# Patient Record
Sex: Female | Born: 1990 | Race: Black or African American | Hispanic: No | Marital: Single | Smoking: Former smoker
Health system: Southern US, Community
[De-identification: ages and names within clinical notes are randomized; demographics above are authoritative.]

## PROBLEM LIST (undated history)

## (undated) DIAGNOSIS — E079 Disorder of thyroid, unspecified: Secondary | ICD-10-CM

## (undated) DIAGNOSIS — B009 Herpesviral infection, unspecified: Secondary | ICD-10-CM

## (undated) DIAGNOSIS — J45909 Unspecified asthma, uncomplicated: Secondary | ICD-10-CM

## (undated) DIAGNOSIS — J40 Bronchitis, not specified as acute or chronic: Secondary | ICD-10-CM

## (undated) HISTORY — PX: TONSILLECTOMY: SUR1361

## (undated) HISTORY — PX: TUBAL LIGATION: SHX77

---
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
## 2011-11-11 ENCOUNTER — Encounter (HOSPITAL_BASED_OUTPATIENT_CLINIC_OR_DEPARTMENT_OTHER): Payer: Self-pay | Admitting: *Deleted

## 2011-11-11 ENCOUNTER — Emergency Department (HOSPITAL_BASED_OUTPATIENT_CLINIC_OR_DEPARTMENT_OTHER)
Admission: EM | Admit: 2011-11-11 | Discharge: 2011-11-11 | Disposition: A | Discharge status: Home / Self Care | Payer: No Typology Code available for payment source | Attending: Emergency Medicine | Admitting: Emergency Medicine

## 2011-11-11 DIAGNOSIS — S339XXA Sprain of unspecified parts of lumbar spine and pelvis, initial encounter: Secondary | ICD-10-CM | POA: Insufficient documentation

## 2011-11-11 DIAGNOSIS — S39012A Strain of muscle, fascia and tendon of lower back, initial encounter: Secondary | ICD-10-CM

## 2011-11-11 MED ORDER — IBUPROFEN 600 MG PO TABS: 600.0000 mg | ORAL_TABLET | Freq: Four times a day (QID) | ORAL | Status: AC | PRN
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
## 2013-01-09 ENCOUNTER — Encounter (HOSPITAL_BASED_OUTPATIENT_CLINIC_OR_DEPARTMENT_OTHER): Payer: Self-pay | Admitting: Emergency Medicine

## 2013-01-09 ENCOUNTER — Emergency Department (HOSPITAL_BASED_OUTPATIENT_CLINIC_OR_DEPARTMENT_OTHER): Payer: Medicaid Other

## 2013-01-09 ENCOUNTER — Emergency Department (HOSPITAL_BASED_OUTPATIENT_CLINIC_OR_DEPARTMENT_OTHER)
Admission: EM | Admit: 2013-01-09 | Discharge: 2013-01-10 | Disposition: A | Discharge status: Skilled Nursing Facility | Payer: Medicaid Other | Attending: Emergency Medicine | Admitting: Emergency Medicine

## 2013-01-09 DIAGNOSIS — Z8619 Personal history of other infectious and parasitic diseases: Secondary | ICD-10-CM | POA: Insufficient documentation

## 2013-01-09 DIAGNOSIS — J45909 Unspecified asthma, uncomplicated: Secondary | ICD-10-CM | POA: Diagnosis not present

## 2013-01-09 DIAGNOSIS — Z79899 Other long term (current) drug therapy: Secondary | ICD-10-CM | POA: Diagnosis not present

## 2013-01-09 DIAGNOSIS — R1084 Generalized abdominal pain: Secondary | ICD-10-CM | POA: Insufficient documentation

## 2013-01-09 DIAGNOSIS — O26899 Other specified pregnancy related conditions, unspecified trimester: Secondary | ICD-10-CM | POA: Diagnosis present

## 2013-01-09 DIAGNOSIS — R109 Unspecified abdominal pain: Secondary | ICD-10-CM

## 2013-01-09 HISTORY — DX: Unspecified asthma, uncomplicated: J45.909

## 2013-01-09 HISTORY — DX: Herpesviral infection, unspecified: B00.9

## 2013-01-09 HISTORY — DX: Bronchitis, not specified as acute or chronic: J40

## 2013-01-09 LAB — URINE MICROSCOPIC-ADD ON

## 2013-01-09 LAB — CBC WITH DIFFERENTIAL/PLATELET
Hemoglobin: 10.3 g/dL — ABNORMAL LOW (ref 12.0–15.0)
Lymphocytes Relative: 27 % (ref 12–46)
Lymphs Abs: 3.8 10*3/uL (ref 0.7–4.0)
MCV: 88.9 fL (ref 78.0–100.0)
Monocytes Relative: 8 % (ref 3–12)
Neutrophils Relative %: 63 % (ref 43–77)
Platelets: 655 10*3/uL — ABNORMAL HIGH (ref 150–400)
RBC: 3.51 MIL/uL — ABNORMAL LOW (ref 3.87–5.11)
WBC: 14.2 10*3/uL — ABNORMAL HIGH (ref 4.0–10.5)

## 2013-01-09 LAB — URINALYSIS, ROUTINE W REFLEX MICROSCOPIC
Hgb urine dipstick: NEGATIVE
Nitrite: NEGATIVE
Specific Gravity, Urine: 1.017 (ref 1.005–1.030)
Urobilinogen, UA: 0.2 mg/dL (ref 0.0–1.0)
pH: 5.5 (ref 5.0–8.0)

## 2013-01-09 LAB — COMPREHENSIVE METABOLIC PANEL
ALT: 29 U/L (ref 0–35)
Alkaline Phosphatase: 768 U/L — ABNORMAL HIGH (ref 39–117)
BUN: 33 mg/dL — ABNORMAL HIGH (ref 6–23)
CO2: 23 mEq/L (ref 19–32)
GFR calc Af Amer: 57 mL/min — ABNORMAL LOW (ref >90)
GFR calc non Af Amer: 49 mL/min — ABNORMAL LOW (ref >90)
Glucose, Bld: 99 mg/dL (ref 70–99)
Potassium: 4.9 mEq/L (ref 3.5–5.1)
Sodium: 135 mEq/L (ref 135–145)
Total Bilirubin: 0.4 mg/dL (ref 0.3–1.2)

## 2013-01-09 MED ORDER — CEFTRIAXONE SODIUM 1 G IJ SOLR
INTRAMUSCULAR | Status: AC
  Filled 2013-01-09: qty 10

## 2013-01-09 MED ORDER — MORPHINE SULFATE 4 MG/ML IJ SOLN
4.0000 mg | Freq: Once | INTRAMUSCULAR | Status: AC
  Administered 2013-01-09: 4 mg via INTRAVENOUS
  Filled 2013-01-09: qty 1

## 2013-01-09 MED ORDER — IOHEXOL 300 MG/ML  SOLN
100.0000 mL | Freq: Once | INTRAMUSCULAR | Status: AC | PRN
  Administered 2013-01-09: 100 mL via INTRAVENOUS

## 2013-01-09 MED ORDER — DEXTROSE 5 % IV SOLN
1.0000 g | Freq: Once | INTRAVENOUS | Status: AC
  Administered 2013-01-09: 1 g via INTRAVENOUS

## 2013-01-09 MED ORDER — SODIUM CHLORIDE 0.9 % IV SOLN
INTRAVENOUS | Status: DC
  Administered 2013-01-09: 23:00:00 via INTRAVENOUS

## 2013-01-09 MED ORDER — HYDROMORPHONE HCL PF 1 MG/ML IJ SOLN
1.0000 mg | Freq: Once | INTRAMUSCULAR | Status: AC
  Administered 2013-01-10: 1 mg via INTRAVENOUS
  Filled 2013-01-09: qty 1

## 2013-01-09 NOTE — ED Provider Notes (Signed)
Medical screening examination/treatment/procedure(s) were conducted as a shared visit with non-physician practitioner(s) and myself.  I personally evaluated the patient during the encounter  Doug Sou, MD 01/09/13 2339

## 2013-01-09 NOTE — ED Provider Notes (Signed)
Patient with low abdominal pain onset 2 hours ago. Pain is moderate to severe. Nothing makes symptoms better or worse patient also complains of pain at cesarean section scar. On exam no distress alert nontoxic abdomen well-healing surgical wound at suprapubic area. Normal bowel sounds Mildly distended. Mild tenderness over lower quadrants bilaterally.  Doug Sou, MD 01/09/13 2124
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
## 2013-01-09 NOTE — ED Notes (Addendum)
Pt had c section 9/22 and how having bad stomach pain, lower abdomen pain and right sided pain, pt has steri strips, denies vomiting diarrhea, c/o nausea

## 2013-01-09 NOTE — ED Notes (Signed)
Attempted to speak to the patient about why we needed her to drink the contrast. Explained that it would enable the CT to be more accurate. Patient would not make eye contact, patient would not answer questions, patient would not acknowledge that she was being spoken to.
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
## 2013-01-09 NOTE — ED Notes (Addendum)
[facility] called for transport to [facility]

## 2013-01-09 NOTE — ED Notes (Signed)
Pelvic cart is at the bedside set up and ready for the doctor to use. 

## 2013-01-09 NOTE — ED Provider Notes (Signed)
CT scan discussed with Dr. Chilton Si, from radiology. We'll treat empirically with ceftriaxone for, nephritis and retained products of conception. We also spoke with Dr. Aldona Bar [facility], as patient is requesting transfer there. Dr. Gerilyn Pilgrim accepts patient in transfer.. She'll be transferred to the emergency department. I have notified the charge nurse at [facility] to call Dr. Gerilyn Pilgrim upon patient's arrival  Doug Sou, MD 01/09/13 2319

## 2013-01-09 NOTE — ED Notes (Signed)
Pt was given contrast

## 2013-01-09 NOTE — ED Notes (Signed)
Mother of pt states that pt will not drink contrast, will speak with pt

## 2013-01-11 LAB — URINE CULTURE: Colony Count: 80000

## 2013-01-13 ENCOUNTER — Telehealth (HOSPITAL_COMMUNITY): Payer: Self-pay | Admitting: Emergency Medicine

## 2013-01-13 NOTE — ED Notes (Signed)
+  Chlamydia. Chart sent to EDP office for review. DHHS attached. 

## 2013-01-13 NOTE — ED Notes (Signed)
Patient has +Chlamydia. 

## 2013-01-17 ENCOUNTER — Telehealth (HOSPITAL_COMMUNITY): Payer: Self-pay | Admitting: Emergency Medicine

## 2013-01-17 NOTE — ED Notes (Signed)
Unable to contact patient via phone. Sent letter. °

## 2013-02-19 ENCOUNTER — Telehealth (HOSPITAL_COMMUNITY): Payer: Self-pay | Admitting: Emergency Medicine

## 2013-02-19 NOTE — ED Notes (Signed)
No response to letter sent after 30 days. Chart sent to Medical Records. °

## 2014-08-29 ENCOUNTER — Emergency Department (HOSPITAL_BASED_OUTPATIENT_CLINIC_OR_DEPARTMENT_OTHER)
Admission: EM | Admit: 2014-08-29 | Discharge: 2014-08-29 | Disposition: A | Discharge status: Home / Self Care | Payer: Medicaid Other | Attending: Emergency Medicine | Admitting: Emergency Medicine

## 2014-08-29 ENCOUNTER — Encounter (HOSPITAL_BASED_OUTPATIENT_CLINIC_OR_DEPARTMENT_OTHER): Payer: Self-pay | Admitting: *Deleted

## 2014-08-29 DIAGNOSIS — O9089 Other complications of the puerperium, not elsewhere classified: Secondary | ICD-10-CM | POA: Diagnosis not present

## 2014-08-29 DIAGNOSIS — Z8619 Personal history of other infectious and parasitic diseases: Secondary | ICD-10-CM | POA: Diagnosis not present

## 2014-08-29 DIAGNOSIS — O9953 Diseases of the respiratory system complicating the puerperium: Secondary | ICD-10-CM | POA: Diagnosis not present

## 2014-08-29 DIAGNOSIS — Z862 Personal history of diseases of the blood and blood-forming organs and certain disorders involving the immune mechanism: Secondary | ICD-10-CM | POA: Insufficient documentation

## 2014-08-29 DIAGNOSIS — J45909 Unspecified asthma, uncomplicated: Secondary | ICD-10-CM | POA: Insufficient documentation

## 2014-08-29 DIAGNOSIS — R6883 Chills (without fever): Secondary | ICD-10-CM | POA: Diagnosis not present

## 2014-08-29 DIAGNOSIS — N939 Abnormal uterine and vaginal bleeding, unspecified: Secondary | ICD-10-CM
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
## 2014-08-29 NOTE — ED Notes (Signed)
States has had vag bleeding since c section on may 12,  Has mbeeen passing blood clots x 2 days,  Some abd pain at site of c section inscision

## 2014-08-29 NOTE — ED Notes (Signed)
Pt in lobby went out to car stating "it's too much for her" and nodding head towardloud group.

## 2014-08-29 NOTE — ED Provider Notes (Signed)
CSN: [id number]     Arrival date & time 08/29/14  1743 History  This chart was scribed for Maureen Fossa, MD by Abel Presto, ED Scribe. This patient was seen in room MH06/MH06 and the patient's care was started at 7:34 PM.    Chief Complaint  Patient presents with  . Vaginal Bleeding    The history is provided by the patient and a parent. No language interpreter was used.    HPI Comments: ELLIZABETH Russell is a 24 y.o. female with PMHx of herpes and anemia who presents to the Emergency Department complaining of constant vaginal bleeding as much as 1 pad per hour since 08/18/14. Pt had a C-section on 08/18/14. She states she was given a transfusion at that time. Pt notes associated chills and tenderness to incision site. Pt was seen at [facility] with labs done earlier this evening. Pt denies fever. Pt is followed by OPGYN at [facility]. Symptoms are moderate and constant since.   Past Medical History  Diagnosis Date  . Asthma   . Bronchitis   . Herpes    Past Surgical History  Procedure Laterality Date  . Tonsillectomy    . Cesarean section     History reviewed. No pertinent family history. History  Substance Use Topics  . Smoking status: Never Smoker   . Smokeless tobacco: Not on file  . Alcohol Use: No   OB History    No data available     Review of Systems  Constitutional: Positive for chills. Negative for fever.  Gastrointestinal: Positive for abdominal pain.  Genitourinary: Positive for vaginal bleeding.  All other systems reviewed and are negative.    Allergies  Review of patient's allergies indicates no known allergies.  Home Medications   Prior to Admission medications   Not on File   BP 110/71 mmHg  Pulse 79  Temp(Src) 97.8 F (36.6 C) (Oral)  Resp 18  Wt 150 lb (68.04 kg)  SpO2 99%  LMP 08/29/2014 Physical Exam  Constitutional: She is oriented to person, place, and time. She appears well-developed and  well-nourished.  HENT:  Head: Normocephalic and atraumatic.  Cardiovascular: Normal rate and regular rhythm.   No murmur heard. Pulmonary/Chest: Effort normal and breath sounds normal. No respiratory distress.  Abdominal: Soft. There is tenderness (mild) in the suprapubic area. There is no rebound and no guarding.  csection site with sutures c/d/i  Genitourinary:  Small amount of dark vaginal bleeding, no tissue in os, no clots.  Cervix boggy and enlarged, slightly open  Musculoskeletal: She exhibits no edema or tenderness.  Neurological: She is alert and oriented to person, place, and time.  Skin: Skin is warm and dry.  Psychiatric: She has a normal mood and affect. Her behavior is normal.  Nursing note and vitals reviewed.   ED Course  Procedures (including critical care time) DIAGNOSTIC STUDIES: Oxygen Saturation is 99% on room air, normal by my interpretation.    COORDINATION OF CARE: 7:39 PM Discussed treatment plan with patient at beside, the patient agrees with the plan and has no further questions at this time.   Labs Review Labs Reviewed - No data to display  Imaging Review No results found.   EKG Interpretation None     7:40 PM Reviewed results from today [facility] ; hemoglobin was 10.9  MDM   Final diagnoses:  Vaginal bleeding   Patient here for evaluation of vaginal bleeding, had a C-section delivery on May 12.  Reviewed labs from patient's [facility] earlier today and her hemoglobin was 10.9 at that time. Patient has a small amount of bleeding on examination. History and examination is not consistent with maternal hemorrhage or endometritis. Discussed with patient importance of keeping her OB/GYN follow-up in 2 days as scheduled. Discussed bleeding return precautions.  I personally performed the services described in this documentation, which was scribed in my presence. The recorded information has been reviewed and is  accurate.     Maureen FossaElizabeth Lindsi Bayliss, MD 08/30/14 0100

## 2014-08-29 NOTE — ED Notes (Signed)
Other pt/visitors telling mom to get another child that is climbing on furniture and according to visitor"about to fall"

## 2014-08-29 NOTE — ED Notes (Signed)
[facility] [phone]

## 2014-08-29 NOTE — Discharge Instructions (Signed)
Abnormal Uterine Bleeding Abnormal uterine bleeding can affect women at various stages in life, including teenagers, women in their reproductive years, pregnant women, and women who have reached menopause. Several kinds of uterine bleeding are considered abnormal, including:  Bleeding or spotting between periods.   Bleeding after sexual intercourse.   Bleeding that is heavier or more than normal.   Periods that last longer than usual.  Bleeding after menopause.  Many cases of abnormal uterine bleeding are minor and simple to treat, while others are more serious. Any type of abnormal bleeding should be evaluated by your health care provider. Treatment will depend on the cause of the bleeding. HOME CARE INSTRUCTIONS Monitor your condition for any changes. The following actions may help to alleviate any discomfort you are experiencing:  Avoid the use of tampons and douches as directed by your health care provider.  Change your pads frequently. You should get regular pelvic exams and Pap tests. Keep all follow-up appointments for diagnostic tests as directed by your health care provider.  SEEK MEDICAL CARE IF:   Your bleeding lasts more than 1 week.   You feel dizzy at times.  SEEK IMMEDIATE MEDICAL CARE IF:   You pass out.   You are changing pads every 15 to 30 minutes.   You have abdominal pain.  You have a fever.   You become sweaty or weak.   You are passing large blood clots from the vagina.   You start to feel nauseous and vomit. MAKE SURE YOU:   Understand these instructions.  Will watch your condition.  Will get help right away if you are not doing well or get worse. Document Released: 03/25/2005 Document Revised: 03/30/2013 Document Reviewed: 10/22/2012 Exit[employer] Patient Information 2015 [employer]. This information is not intended to replace advice given to you by your health care provider. Make sure you discuss any questions you have with your  health care provider.  

## 2014-08-29 NOTE — ED Notes (Signed)
c-section on May 12th.  Reports that she continues to have vaginal bleeding.

## 2017-01-25 ENCOUNTER — Encounter (HOSPITAL_BASED_OUTPATIENT_CLINIC_OR_DEPARTMENT_OTHER): Payer: Self-pay | Admitting: Emergency Medicine

## 2017-01-25 ENCOUNTER — Emergency Department (HOSPITAL_BASED_OUTPATIENT_CLINIC_OR_DEPARTMENT_OTHER)
Admission: EM | Admit: 2017-01-25 | Discharge: 2017-01-25 | Disposition: A | Discharge status: Home / Self Care | Payer: Medicaid Other | Attending: Emergency Medicine | Admitting: Emergency Medicine

## 2017-01-25 DIAGNOSIS — J45909 Unspecified asthma, uncomplicated: Secondary | ICD-10-CM | POA: Insufficient documentation

## 2017-01-25 DIAGNOSIS — J069 Acute upper respiratory infection, unspecified: Secondary | ICD-10-CM | POA: Insufficient documentation

## 2017-01-25 DIAGNOSIS — R51 Headache: Secondary | ICD-10-CM | POA: Diagnosis present

## 2017-01-25 NOTE — ED Triage Notes (Signed)
PT presents with c/o pain to her face around her nose and stuffy nose.

## 2017-01-25 NOTE — ED Provider Notes (Signed)
[facility] EMERGENCY DEPARTMENT Provider Note   CSN: [id number] Arrival date & time: 01/25/17  1450     History   Chief Complaint Chief Complaint  Patient presents with  . Headache    HPI Maureen Russell is a 26 y.o. female.  Chief complaint is cough, cold, and sinus pressure.  HPI:  Patient has had cold symptoms since Wednesday. Runny nose. Sinus burning. Sre throat. Occasional cough. No fevr. No shortness of breath. No sinus pain. No ear pain.  Past Medical History:  Diagnosis Date  . Asthma   . Bronchitis   . Herpes     There are no active problems to display for this patient.   Past Surgical History:  Procedure Laterality Date  . CESAREAN SECTION    . TONSILLECTOMY      OB History    No data available       Home Medications    Prior to Admission medications   Not on File    Family History No family history on file.  Social History Social History  Substance Use Topics  . Smoking status: Never Smoker  . Smokeless tobacco: Not on file  . Alcohol use No     Allergies   Patient has no known allergies.   Review of Systems Review of Systems  Constitutional: Negative for appetite change, chills, diaphoresis, fatigue and fever.  HENT: Positive for congestion, postnasal drip, rhinorrhea, sinus pressure and sneezing. Negative for mouth sores, sore throat and trouble swallowing.   Eyes: Negative for visual disturbance.  Respiratory: Positive for cough. Negative for chest tightness, shortness of breath and wheezing.   Cardiovascular: Negative for chest pain.  Gastrointestinal: Negative for abdominal distention, abdominal pain, diarrhea, nausea and vomiting.  Endocrine: Negative for polydipsia, polyphagia and polyuria.  Genitourinary: Negative for dysuria, frequency and hematuria.  Musculoskeletal: Negative for gait problem.  Skin: Negative for color change, pallor and rash.  Neurological: Negative for dizziness, syncope, light-headedness  and headaches.  Hematological: Does not bruise/bleed easily.  Psychiatric/Behavioral: Negative for behavioral problems and confusion.     Physical Exam Updated Vital Signs BP 108/74 (BP Location: Left Arm)   Pulse 68   Temp 98.1 F (36.7 C) (Oral)   Resp 16   LMP 01/11/2017   SpO2 100%   Physical Exam  Constitutional: She is oriented to person, place, and time. She appears well-developed and well-nourished. No distress.  HENT:  Head: Normocephalic.  No posterior pharyngeal erythema or exudate. Normal TMs. No adenopathy in the neck. No stridor. Normal voice. Clear lungs.  Eyes: Pupils are equal, round, and reactive to light. Conjunctivae are normal. No scleral icterus.  Neck: Normal range of motion. Neck supple. No thyromegaly present.  Cardiovascular: Normal rate and regular rhythm.  Exam reveals no gallop and no friction rub.   No murmur heard. Pulmonary/Chest: Effort normal and breath sounds normal. No respiratory distress. She has no wheezes. She has no rales.  Abdominal: Soft. Bowel sounds are normal. She exhibits no distension. There is no tenderness. There is no rebound.  Musculoskeletal: Normal range of motion.  Neurological: She is alert and oriented to person, place, and time.  Skin: Skin is warm and dry. No rash noted.  Psychiatric: She has a normal mood and affect. Her behavior is normal.     ED Treatments / Results  Labs (all labs ordered are listed, but only abnormal results are displayed) Labs Reviewed - No data to display  EKG  EKG Interpretation None  Radiology No results found.  Procedures Procedures (including critical care time)  Medications Ordered in ED Medications - No data to display   Initial Impression / Assessment and Plan / ED Course  I have reviewed the triage vital signs and the nursing notes.  Pertinent labs & imaging results that were available during my care of the patient were reviewed by me and considered in my medical  decision making (see chart for details).     Uncomplicated upper respiratory infection.  Final Clinical Impressions(s) / ED Diagnoses   Final diagnoses:  Upper respiratory tract infection, unspecified type    New Prescriptions New Prescriptions   No medications on file     Rolland Porter, MD 01/25/17 1659

## 2017-05-30 ENCOUNTER — Encounter (HOSPITAL_BASED_OUTPATIENT_CLINIC_OR_DEPARTMENT_OTHER): Payer: Self-pay | Admitting: *Deleted

## 2017-05-30 ENCOUNTER — Emergency Department (HOSPITAL_BASED_OUTPATIENT_CLINIC_OR_DEPARTMENT_OTHER)
Admission: EM | Admit: 2017-05-30 | Discharge: 2017-05-30 | Disposition: A | Discharge status: Home / Self Care | Payer: Medicaid Other | Attending: Emergency Medicine | Admitting: Emergency Medicine

## 2017-05-30 ENCOUNTER — Other Ambulatory Visit: Payer: Self-pay

## 2017-05-30 DIAGNOSIS — M791 Myalgia, unspecified site: Secondary | ICD-10-CM | POA: Diagnosis present

## 2017-05-30 DIAGNOSIS — R072 Precordial pain: Secondary | ICD-10-CM | POA: Diagnosis not present

## 2017-05-30 DIAGNOSIS — J45909 Unspecified asthma, uncomplicated: Secondary | ICD-10-CM | POA: Diagnosis not present

## 2017-05-30 DIAGNOSIS — R1013 Epigastric pain: Secondary | ICD-10-CM | POA: Diagnosis not present

## 2017-05-30 DIAGNOSIS — J069 Acute upper respiratory infection, unspecified: Secondary | ICD-10-CM | POA: Insufficient documentation

## 2017-05-30 DIAGNOSIS — R52 Pain, unspecified: Secondary | ICD-10-CM

## 2017-05-30 MED ORDER — KETOROLAC TROMETHAMINE 30 MG/ML IJ SOLN
30.0000 mg | Freq: Once | INTRAMUSCULAR | Status: AC
  Administered 2017-05-30: 30 mg via INTRAVENOUS
  Filled 2017-05-30: qty 1

## 2017-05-30 MED ORDER — METHOCARBAMOL 500 MG PO TABS: 500.0000 mg | ORAL_TABLET | Freq: Four times a day (QID) | ORAL | 0 refills | Status: AC

## 2017-05-30 MED ORDER — GI COCKTAIL ~~LOC~~
30.0000 mL | Freq: Once | ORAL | Status: AC
  Administered 2017-05-30: 30 mL via ORAL
  Filled 2017-05-30: qty 30

## 2017-05-30 NOTE — ED Provider Notes (Signed)
[facility] EMERGENCY DEPARTMENT Provider Note   CSN: [id number] Arrival date & time: 05/30/17  4098     History   Chief Complaint No chief complaint on file.   HPI Maureen Russell is a 27 y.o. female.  Patient is a 27 year old female presenting with body aches.  PMH significant for childhood asthma which has been well controlled for several years without need for inhaler.  Onset 2 days ago with sudden onset body aches including upper back, right shoulder, and neck along with clear rhinorrhea, and tension-like headache.  Patient took a dose of Advil without improvement.  Patient also endorsing mild epigastric pain.  Pain occasionally feels like it substernal.  Pain is not affected by eating.  Patient denies history of NSAID use with exception of single dose of Advil during onset.  She denies history of fevers or chills, nausea or vomiting, diarrhea, shortness of breath, wheeze.  Patient is a non-smoker and denies alcohol or illicit drug use.  She works at [employer].      Past Medical History:  Diagnosis Date  . Asthma   . Bronchitis   . Herpes     There are no active problems to display for this patient.   Past Surgical History:  Procedure Laterality Date  . CESAREAN SECTION    . TONSILLECTOMY      OB History    No data available       Home Medications    Prior to Admission medications   Medication Sig Start Date End Date Taking? Authorizing Provider  methocarbamol (ROBAXIN) 500 MG tablet Take 1 tablet (500 mg total) by mouth 4 (four) times daily for 5 days. 05/30/17 06/04/17  Wendee Beavers, DO    Family History History reviewed. No pertinent family history.  Social History Social History   Tobacco Use  . Smoking status: Never Smoker  Substance Use Topics  . Alcohol use: No  . Drug use: No     Allergies   Patient has no known allergies.   Review of Systems Review of Systems  Constitutional: Negative for chills, diaphoresis,  fatigue and fever.  HENT: Positive for rhinorrhea. Negative for congestion, ear pain, sneezing and sore throat.   Eyes: Negative for pain and visual disturbance.  Respiratory: Negative for cough, shortness of breath and wheezing.   Cardiovascular: Positive for chest pain. Negative for palpitations.  Gastrointestinal: Positive for abdominal pain. Negative for diarrhea, nausea and vomiting.  Genitourinary: Negative for dysuria and hematuria.  Musculoskeletal: Positive for arthralgias, back pain and neck pain. Negative for neck stiffness.  Skin: Negative for color change and rash.  Neurological: Positive for headaches. Negative for dizziness.  All other systems reviewed and are negative.    Physical Exam Updated Vital Signs BP 103/62 (BP Location: Right Arm)   Pulse 86   Temp 98.3 F (36.8 C) (Oral)   Resp 18   Ht 5\' 4"  (1.626 m)   Wt 59.6 kg (131 lb 6.3 oz)   LMP 05/04/2017 (Approximate)   SpO2 97%   BMI 22.55 kg/m   Physical Exam  Constitutional: She appears well-developed and well-nourished. No distress.  HENT:  Head: Normocephalic and atraumatic.  Eyes: Conjunctivae and EOM are normal. Pupils are equal, round, and reactive to light. No scleral icterus.  Neck: Normal range of motion. Neck supple.  Mild tenderness on sternocleidomastoid bilaterally  Cardiovascular: Normal rate and regular rhythm.  No murmur heard. Pulmonary/Chest: Effort normal and breath sounds normal. No respiratory distress.  Abdominal: Soft. She exhibits no distension and no mass. There is no rebound and no guarding.  Mild epigastric pain present  Musculoskeletal: She exhibits no edema.  Lymphadenopathy:    She has no cervical adenopathy.  Neurological: She is alert.  Skin: Skin is warm and dry. No rash noted. She is not diaphoretic. No pallor.  Psychiatric: She has a normal mood and affect.  Nursing note and vitals reviewed.    ED Treatments / Results  Labs (all labs ordered are listed, but  only abnormal results are displayed) Labs Reviewed - No data to display  EKG  EKG Interpretation  Date/Time:  Friday May 30 2017 11:06:04 EST Ventricular Rate:  57 PR Interval:    QRS Duration: 90 QT Interval:  422 QTC Calculation: 411 R Axis:   68 Text Interpretation:  Sinus rhythm, pw small Borderline low voltage, extremity leads No previous ECGs available Confirmed by Alvira MondaySchlossman, Erin ([id number]) on 05/30/2017 11:17:39 AM       Radiology No results found.  Procedures Procedures (including critical care time)  Medications Ordered in ED Medications  gi cocktail (Maalox,Lidocaine,Donnatal) (30 mLs Oral Given 05/30/17 1045)  ketorolac (TORADOL) 30 MG/ML injection 30 mg (30 mg Intravenous Given 05/30/17 1045)     Initial Impression / Assessment and Plan / ED Course  I have reviewed the triage vital signs and the nursing notes.  Pertinent labs & imaging results that were available during my care of the patient were reviewed by me and considered in my medical decision making (see chart for details).  Patient is a 27 year old female presenting with body aches.  PMH significant for childhood asthma which has been well controlled for several years without need for inhaler.  Vital stable on arrival.  Patient well-appearing on exam.  Does have mild epigastric pain with intermittent episodes of substernal chest pain.  Will get EKG.  No history of NSAID abuse or GI bleed.  Will give GI cocktail.  Patient without jaundice with negative Murphy sign making gallbladder disease unlikely.  Patient able to tolerate oral intake and comfortable making pancreatitis less likely.  Normotensive without hypertension dissection unlikely.  Suspect viral URI symptoms occluding clear rhinorrhea and some pharyngeal erythema.  We will give a dose of Toradol for multiple joint pains.  Headache is resolved at present.  EKG NSR without ST changes or T wave abnormalities.  Patient's epigastric pain improved with  GI cocktail.  Suspect epigastric pain is related to viral illness.  Multiple joint pains improved with Toradol use.  Will give prescription for Robaxin.  Reviewed return precautions.  Instructed patient to follow-up with PCP.  Final Clinical Impressions(s) / ED Diagnoses   Final diagnoses:  Viral URI  Epigastric pain  Body aches    ED Discharge Orders        Ordered    methocarbamol (ROBAXIN) 500 MG tablet  4 times daily     05/30/17 1033       Wendee BeaversMcMullen, David J, DO 05/30/17 1202    Alvira MondaySchlossman, Erin, MD 06/01/17 [phone]

## 2017-05-30 NOTE — ED Notes (Signed)
ED Provider at bedside. 

## 2017-05-30 NOTE — ED Triage Notes (Signed)
Started 2 days ago, Pt started hurting all over, neck, back, and sharp pains in stomach.

## 2017-05-30 NOTE — Discharge Instructions (Addendum)
Your symptoms are likely from a viral infection. Often times viruses can irritate the stomach.  Avoid ibuprofen or other NSAIDs and take Tylenol for headache and body ache every 6 hours.  I have also given you a muscle relaxer which you can take up to 4 times daily.  I would advise you not to drive while on this medication as this can be sedating.  Do not operate heavy machinery while taking this medication.

## 2017-06-11 ENCOUNTER — Other Ambulatory Visit: Payer: Self-pay

## 2017-06-11 ENCOUNTER — Encounter (HOSPITAL_BASED_OUTPATIENT_CLINIC_OR_DEPARTMENT_OTHER): Payer: Self-pay | Admitting: Emergency Medicine

## 2017-06-11 ENCOUNTER — Emergency Department (HOSPITAL_BASED_OUTPATIENT_CLINIC_OR_DEPARTMENT_OTHER)
Admission: EM | Admit: 2017-06-11 | Discharge: 2017-06-11 | Disposition: A | Discharge status: Home / Self Care | Payer: Medicaid Other | Attending: Emergency Medicine | Admitting: Emergency Medicine

## 2017-06-11 DIAGNOSIS — J029 Acute pharyngitis, unspecified: Secondary | ICD-10-CM | POA: Diagnosis not present

## 2017-06-11 DIAGNOSIS — H6691 Otitis media, unspecified, right ear: Secondary | ICD-10-CM | POA: Insufficient documentation

## 2017-06-11 DIAGNOSIS — J45909 Unspecified asthma, uncomplicated: Secondary | ICD-10-CM | POA: Diagnosis not present

## 2017-06-11 MED ORDER — IBUPROFEN 400 MG PO TABS
400.0000 mg | ORAL_TABLET | Freq: Once | ORAL | Status: AC
  Administered 2017-06-11: 400 mg via ORAL
  Filled 2017-06-11: qty 1

## 2017-06-11 MED ORDER — AMOXICILLIN 500 MG PO CAPS: 500.0000 mg | ORAL_CAPSULE | Freq: Three times a day (TID) | ORAL | 0 refills | Status: DC

## 2017-06-11 MED ORDER — DEXAMETHASONE 4 MG PO TABS
8.0000 mg | ORAL_TABLET | Freq: Once | ORAL | Status: AC
  Administered 2017-06-11: 8 mg via ORAL
  Filled 2017-06-11: qty 2

## 2017-06-11 NOTE — ED Triage Notes (Signed)
Sore throat, sinus pressure, fatigue, and body aches since yesterday. Children also sick. They are being treated for strep.

## 2017-06-11 NOTE — ED Notes (Signed)
Pt discharged to home with family. NAD.  

## 2017-06-12 ENCOUNTER — Emergency Department (HOSPITAL_BASED_OUTPATIENT_CLINIC_OR_DEPARTMENT_OTHER)
Admission: EM | Admit: 2017-06-12 | Discharge: 2017-06-12 | Disposition: A | Discharge status: Home / Self Care | Payer: Medicaid Other | Attending: Emergency Medicine | Admitting: Emergency Medicine

## 2017-06-12 ENCOUNTER — Other Ambulatory Visit: Payer: Self-pay

## 2017-06-12 ENCOUNTER — Encounter (HOSPITAL_BASED_OUTPATIENT_CLINIC_OR_DEPARTMENT_OTHER): Payer: Self-pay | Admitting: Emergency Medicine

## 2017-06-12 DIAGNOSIS — J45909 Unspecified asthma, uncomplicated: Secondary | ICD-10-CM | POA: Diagnosis not present

## 2017-06-12 DIAGNOSIS — J029 Acute pharyngitis, unspecified: Secondary | ICD-10-CM

## 2017-06-12 MED ORDER — IBUPROFEN 600 MG PO TABS: 600.0000 mg | ORAL_TABLET | Freq: Four times a day (QID) | ORAL | 0 refills | Status: DC | PRN

## 2017-06-12 NOTE — ED Provider Notes (Signed)
[facility] EMERGENCY DEPARTMENT Provider Note   CSN: [id number] Arrival date & time: 06/12/17  1037     History   Chief Complaint Chief Complaint  Patient presents with  . flu like symptoms    HPI Maureen Russell is a 27 y.o. female.  The history is provided by the patient and medical records. No language interpreter was used.   Maureen Russell is a 27 y.o. female  with a PMH as listed below who presents to the Emergency Department complaining of persistent sore throat, sinus pressure and body aches.  She has a daughter at home who tested positive for strep and was started on treatment.  She was seen in ED yesterday.  She was thought to have right otitis media as well as likely strep throat.  No strep test was performed, but she was started on amoxicillin for likely given daughter tested positive.  She returns to ER today for persistent symptoms and inquiring why she was not tested for strep throat.  Denies fever or chills.  No difficulty swallowing.   Past Medical History:  Diagnosis Date  . Asthma   . Bronchitis   . Herpes     There are no active problems to display for this patient.   Past Surgical History:  Procedure Laterality Date  . CESAREAN SECTION    . TONSILLECTOMY    . TUBAL LIGATION      OB History    No data available       Home Medications    Prior to Admission medications   Medication Sig Start Date End Date Taking? Authorizing Provider  amoxicillin (AMOXIL) 500 MG capsule Take 1 capsule (500 mg total) by mouth 3 (three) times daily. 06/11/17   Raeford RazorKohut, Stephen, MD  ibuprofen (ADVIL,MOTRIN) 600 MG tablet Take 1 tablet (600 mg total) by mouth every 6 (six) hours as needed. 06/12/17   Ward, Chase PicketJaime Pilcher, PA-C    Family History No family history on file.  Social History Social History   Tobacco Use  . Smoking status: Never Smoker  . Smokeless tobacco: Never Used  Substance Use Topics  . Alcohol use: No  . Drug use: No      Allergies   Patient has no known allergies.   Review of Systems Review of Systems  Constitutional: Negative for chills and fever.  HENT: Positive for congestion, sinus pressure and sore throat. Negative for trouble swallowing.   Respiratory: Negative for cough and shortness of breath.   Cardiovascular: Negative for chest pain.  Gastrointestinal: Negative for abdominal pain.     Physical Exam Updated Vital Signs LMP 06/05/2017   Physical Exam  Constitutional: She appears well-developed and well-nourished. No distress.  HENT:  Head: Normocephalic and atraumatic.  Oropharynx with erythema and tonsillar hypertrophy, no exudates appreciated.  No peritonsillar abscess.  Midline uvula.  No focal areas of sinus tenderness.  Right TM erythematous.  Neck: Neck supple.  Cardiovascular: Normal rate, regular rhythm and normal heart sounds.  No murmur heard. Pulmonary/Chest: Effort normal and breath sounds normal. No respiratory distress. She has no wheezes. She has no rales.  Lungs clear to auscultation bilaterally.  Musculoskeletal: Normal range of motion.  Neurological: She is alert.  Skin: Skin is warm and dry.  Nursing note and vitals reviewed.    ED Treatments / Results  Labs (all labs ordered are listed, but only abnormal results are displayed) Labs Reviewed - No data to display  EKG  EKG Interpretation None  Radiology No results found.  Procedures Procedures (including critical care time)  Medications Ordered in ED Medications - No data to display   Initial Impression / Assessment and Plan / ED Course  I have reviewed the triage vital signs and the nursing notes.  Pertinent labs & imaging results that were available during my care of the patient were reviewed by me and considered in my medical decision making (see chart for details).    Maureen Russell is a 27 y.o. female who presents to ED for sore throat, sinus pressure.  Seen in emergency  department yesterday where she was started on amoxicillin for likely strep given daughter tested positive.  She returns to ER today for persistent symptoms and inquiring about needing strep test.  Discussed that she is already on treatment for strep, therefore strep test would not change management or plan of care.  Encouraged her to continue treatment.  Ibuprofen as needed for pain.  PCP follow-up if symptoms persist.  All questions answered.   Final Clinical Impressions(s) / ED Diagnoses   Final diagnoses:  Sore throat    ED Discharge Orders        Ordered    ibuprofen (ADVIL,MOTRIN) 600 MG tablet  Every 6 hours PRN     06/12/17 1054       Ward, Chase Picket, PA-C 06/12/17 1145    Raeford Razor, MD 06/12/17 1212

## 2017-06-12 NOTE — ED Triage Notes (Signed)
Sore throat, body aches for several days. Started on amoxicillin yesterday due to daughter having strep throat. Pt states "no body tested me for anything and I need to know what is going on"

## 2017-06-12 NOTE — Discharge Instructions (Signed)
Continue taking your antibiotics.  Ibuprofen as needed for pain.  Follow up with your primary care doctor if your symptoms persist.  Return to ER for new or worsening symptoms, any additional concerns.

## 2017-06-13 NOTE — ED Provider Notes (Signed)
[facility] EMERGENCY DEPARTMENT Provider Note   CSN: [id number] Arrival date & time: 06/11/17  0908     History   Chief Complaint Chief Complaint  Patient presents with  . Sore Throat    HPI Maureen Russell is a 27 y.o. female.  HPI   27 year old female with sore throat.  Onset several days ago.  Associated body aches, fatigue and subjective fever.  Multiple children currently be treated for strep throat.  No abdominal pain.  No urinary complaints.  Pain in both ears, R>L.   Past Medical History:  Diagnosis Date  . Asthma   . Bronchitis   . Herpes     There are no active problems to display for this patient.   Past Surgical History:  Procedure Laterality Date  . CESAREAN SECTION    . TONSILLECTOMY    . TUBAL LIGATION      OB History    No data available       Home Medications    Prior to Admission medications   Medication Sig Start Date End Date Taking? Authorizing Provider  amoxicillin (AMOXIL) 500 MG capsule Take 1 capsule (500 mg total) by mouth 3 (three) times daily. 06/11/17   Raeford Razor, MD  ibuprofen (ADVIL,MOTRIN) 600 MG tablet Take 1 tablet (600 mg total) by mouth every 6 (six) hours as needed. 06/12/17   Ward, Chase Picket, PA-C    Family History No family history on file.  Social History Social History   Tobacco Use  . Smoking status: Never Smoker  . Smokeless tobacco: Never Used  Substance Use Topics  . Alcohol use: No  . Drug use: No     Allergies   Patient has no known allergies.   Review of Systems Review of Systems  All systems reviewed and negative, other than as noted in HPI.  Physical Exam Updated Vital Signs BP 110/74 (BP Location: Left Arm)   Pulse 86   Temp 98.2 F (36.8 C) (Oral)   Resp 19   Ht 5\' 4"  (1.626 m)   Wt 59.4 kg (131 lb)   LMP 06/05/2017   SpO2 100%   BMI 22.49 kg/m   Physical Exam  Constitutional: She appears well-developed and well-nourished. No distress.  HENT:  Head:  Normocephalic and atraumatic.  Scarring of bilateral tympanic membranes.  Left is otherwise unremarkable.  Right is erythematous.  Dull.  Retracted.  Pharyngitis without exudate.  Uvula is midline.  Normal sounding voice.  Neck is supple.  Handling secretions.  No stridor.  Eyes: Conjunctivae are normal. Right eye exhibits no discharge. Left eye exhibits no discharge.  Neck: Neck supple.  Cardiovascular: Normal rate, regular rhythm and normal heart sounds. Exam reveals no gallop and no friction rub.  No murmur heard. Pulmonary/Chest: Effort normal and breath sounds normal. No respiratory distress.  Abdominal: Soft. She exhibits no distension. There is no tenderness.  Musculoskeletal: She exhibits no edema or tenderness.  Neurological: She is alert.  Skin: Skin is warm and dry.  Psychiatric: She has a normal mood and affect. Her behavior is normal. Thought content normal.  Nursing note and vitals reviewed.    ED Treatments / Results  Labs (all labs ordered are listed, but only abnormal results are displayed) Labs Reviewed - No data to display  EKG  EKG Interpretation None       Radiology No results found.  Procedures Procedures (including critical care time)  Medications Ordered in ED Medications  dexamethasone (DECADRON) tablet 8  mg (8 mg Oral Given 06/11/17 1015)  ibuprofen (ADVIL,MOTRIN) tablet 400 mg (400 mg Oral Given 06/11/17 1015)     Initial Impression / Assessment and Plan / ED Course  I have reviewed the triage vital signs and the nursing notes.  Pertinent labs & imaging results that were available during my care of the patient were reviewed by me and considered in my medical decision making (see chart for details).     27 year old female with sore throat, fevers and ear pain.  Otitis media on the right on exam.  Some pharyngitis without exudate.  Low suspicion for serious deep space neck infection.  Multiple kids with strep throat.  Testing was deferred.  Will  discussion and treat her.  Final Clinical Impressions(s) / ED Diagnoses   Final diagnoses:  Pharyngitis, unspecified etiology  Right otitis media, unspecified otitis media type    ED Discharge Orders        Ordered    amoxicillin (AMOXIL) 500 MG capsule  3 times daily     06/11/17 0954       Raeford RazorKohut, Livia Tarr, MD 06/13/17 1547

## 2017-07-10 ENCOUNTER — Encounter (HOSPITAL_BASED_OUTPATIENT_CLINIC_OR_DEPARTMENT_OTHER): Payer: Self-pay

## 2017-07-10 ENCOUNTER — Emergency Department (HOSPITAL_BASED_OUTPATIENT_CLINIC_OR_DEPARTMENT_OTHER): Payer: Medicaid Other

## 2017-07-10 ENCOUNTER — Other Ambulatory Visit: Payer: Self-pay

## 2017-07-10 ENCOUNTER — Emergency Department (HOSPITAL_BASED_OUTPATIENT_CLINIC_OR_DEPARTMENT_OTHER)
Admission: EM | Admit: 2017-07-10 | Discharge: 2017-07-10 | Disposition: A | Discharge status: Home / Self Care | Payer: Medicaid Other | Attending: Emergency Medicine | Admitting: Emergency Medicine

## 2017-07-10 DIAGNOSIS — R05 Cough: Secondary | ICD-10-CM | POA: Diagnosis not present

## 2017-07-10 DIAGNOSIS — R058 Other specified cough: Secondary | ICD-10-CM

## 2017-07-10 DIAGNOSIS — J45909 Unspecified asthma, uncomplicated: Secondary | ICD-10-CM | POA: Diagnosis not present

## 2017-07-10 DIAGNOSIS — E0789 Other specified disorders of thyroid: Secondary | ICD-10-CM

## 2017-07-10 DIAGNOSIS — E079 Disorder of thyroid, unspecified: Secondary | ICD-10-CM | POA: Diagnosis not present

## 2017-07-10 HISTORY — DX: Disorder of thyroid, unspecified: E07.9

## 2017-07-10 NOTE — ED Provider Notes (Signed)
[facility] Provider Note: Lowella Dell, MD, FACEP  CSN: [id number] MRN: [id number] ARRIVAL: 07/10/17 at 0038 ROOM: [facility]   CHIEF COMPLAINT  Sore Throat   HISTORY OF PRESENT ILLNESS  07/10/17 1:12 AM Maureen Russell is a 27 y.o. female who was treated for strep throat about a month ago with amoxicillin.  She was also recently diagnosed with what sounds like hyperthyroidism.  She is here with a 2-day history of cough productive of sputum that has been blood streaked at times.  She is also complaining of a sore throat.  She is having pain in the left side of her anterior neck, worse with swallowing or palpation.  She is not aware of having a fever.  She rates the pain in her neck is a 7 out of 10.  She has an appointment with an endocrinologist later today.   Past Medical History:  Diagnosis Date  . Asthma   . Bronchitis   . Herpes   . Thyroid disease     Past Surgical History:  Procedure Laterality Date  . CESAREAN SECTION    . TONSILLECTOMY    . TUBAL LIGATION      No family history on file.  Social History   Tobacco Use  . Smoking status: Never Smoker  . Smokeless tobacco: Never Used  Substance Use Topics  . Alcohol use: No  . Drug use: No    Prior to Admission medications   Medication Sig Start Date End Date Taking? Authorizing Provider  amoxicillin (AMOXIL) 500 MG capsule Take 1 capsule (500 mg total) by mouth 3 (three) times daily. 06/11/17   Raeford Razor, MD  ibuprofen (ADVIL,MOTRIN) 600 MG tablet Take 1 tablet (600 mg total) by mouth every 6 (six) hours as needed. 06/12/17   Ward, Chase Picket, PA-C    Allergies Patient has no known allergies.   REVIEW OF SYSTEMS  Negative except as noted here or in the History of Present Illness.   PHYSICAL EXAMINATION  Initial Vital Signs Blood pressure 126/79, pulse 60, temperature 98.2 F (36.8 C), temperature source Oral, resp. rate 16, height 5\' 7"  (1.702 m), weight 61.2 kg (135 lb), last  menstrual period 07/03/2017, SpO2 100 %.  Examination General: Well-developed, well-nourished female in no acute distress; appearance consistent with age of record HENT: normocephalic; atraumatic; tender, mildly enlarged left lobe thyroid; no pharyngeal erythema or exudate; no stridor; no dysphonia Eyes: pupils equal, round and reactive to light; extraocular muscles intact Neck: supple Heart: regular rate and rhythm Lungs: clear to auscultation bilaterally Abdomen: soft; nondistended; nontender; bowel sounds present Extremities: No deformity; full range of motion; pulses normal Neurologic: Awake, alert and oriented; motor function intact in all extremities and symmetric; no facial droop Skin: Warm and dry Psychiatric: Normal mood and affect   RESULTS  Summary of this visit's results, reviewed by myself:   EKG Interpretation  Date/Time:    Ventricular Rate:    PR Interval:    QRS Duration:   QT Interval:    QTC Calculation:   R Axis:     Text Interpretation:        Laboratory Studies: No results found for this or any previous visit (from the past 24 hour(s)). Imaging Studies: Dg Neck Soft Tissue  Result Date: 07/10/2017 CLINICAL DATA:  Productive cough and a sore throat EXAM: NECK SOFT TISSUES - 1+ VIEW COMPARISON:  None. FINDINGS: There is no evidence of retropharyngeal soft tissue swelling or epiglottic enlargement. The cervical airway is  unremarkable and no radio-opaque foreign body identified. IMPRESSION: Negative. Electronically Signed   By: Jasmine PangKim  Fujinaga M.D.   On: 07/10/2017 01:37   Dg Chest 2 View  Result Date: 07/10/2017 CLINICAL DATA:  Cough EXAM: CHEST - 2 VIEW COMPARISON:  None. FINDINGS: The heart size and mediastinal contours are within normal limits. Both lungs are clear. The visualized skeletal structures are unremarkable. IMPRESSION: No active cardiopulmonary disease. Electronically Signed   By: Deatra RobinsonKevin  Herman M.D.   On: 07/10/2017 01:44    ED COURSE  Nursing  notes and initial vitals signs, including pulse oximetry, reviewed.  Vitals:   07/10/17 0045 07/10/17 0046  BP: 126/79   Pulse: 60   Resp: 16   Temp: 98.2 F (36.8 C)   TempSrc: Oral   SpO2: 100%   Weight:  61.2 kg (135 lb)  Height:  5\' 7"  (1.702 m)   Patient advised of reassuring x-ray findings.  As noted above she has a first appointment with her endocrinologist later this morning.  I do not see any need for acute intervention at this time.  PROCEDURES    ED DIAGNOSES     ICD-10-CM   1. Productive cough R05   2. Painful thyroid E07.89        Paula LibraMolpus, Jovanne Riggenbach, MD 07/10/17 [phone]

## 2017-07-10 NOTE — ED Triage Notes (Addendum)
Pt c/o sore throat for three weeks, seen several times, tonight she felt like there was something stuck in her throat and at one time when she coughed up mucus, it had red in it, sees endocrinology in the morning for first appointment

## 2018-04-24 ENCOUNTER — Other Ambulatory Visit: Payer: Self-pay

## 2018-04-24 ENCOUNTER — Encounter (HOSPITAL_BASED_OUTPATIENT_CLINIC_OR_DEPARTMENT_OTHER): Payer: Self-pay | Admitting: *Deleted

## 2018-04-24 ENCOUNTER — Emergency Department (HOSPITAL_BASED_OUTPATIENT_CLINIC_OR_DEPARTMENT_OTHER)
Admission: EM | Admit: 2018-04-24 | Discharge: 2018-04-24 | Disposition: A | Discharge status: Home / Self Care | Payer: Medicaid Other | Attending: Emergency Medicine | Admitting: Emergency Medicine

## 2018-04-24 DIAGNOSIS — Y929 Unspecified place or not applicable: Secondary | ICD-10-CM | POA: Insufficient documentation

## 2018-04-24 DIAGNOSIS — X58XXXA Exposure to other specified factors, initial encounter: Secondary | ICD-10-CM | POA: Diagnosis not present

## 2018-04-24 DIAGNOSIS — J45909 Unspecified asthma, uncomplicated: Secondary | ICD-10-CM | POA: Insufficient documentation

## 2018-04-24 DIAGNOSIS — S3992XA Unspecified injury of lower back, initial encounter: Secondary | ICD-10-CM | POA: Diagnosis present

## 2018-04-24 DIAGNOSIS — Y999 Unspecified external cause status: Secondary | ICD-10-CM | POA: Diagnosis not present

## 2018-04-24 DIAGNOSIS — Y939 Activity, unspecified: Secondary | ICD-10-CM | POA: Diagnosis not present

## 2018-04-24 DIAGNOSIS — S39012A Strain of muscle, fascia and tendon of lower back, initial encounter: Secondary | ICD-10-CM

## 2018-04-24 MED ORDER — IBUPROFEN 600 MG PO TABS: 600.0000 mg | ORAL_TABLET | Freq: Four times a day (QID) | ORAL | 0 refills | Status: DC | PRN

## 2018-04-24 MED ORDER — METHOCARBAMOL 500 MG PO TABS: 500.0000 mg | ORAL_TABLET | Freq: Three times a day (TID) | ORAL | 0 refills | Status: DC | PRN
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
## 2018-04-24 NOTE — ED Notes (Signed)
ED Provider at bedside. 

## 2018-04-24 NOTE — ED Notes (Signed)
Waiting for pt to check in dependent children at registration.
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
## 2018-06-01 ENCOUNTER — Emergency Department (HOSPITAL_BASED_OUTPATIENT_CLINIC_OR_DEPARTMENT_OTHER)
Admission: EM | Admit: 2018-06-01 | Discharge: 2018-06-01 | Disposition: A | Discharge status: Home / Self Care | Payer: Medicaid Other | Attending: Emergency Medicine | Admitting: Emergency Medicine

## 2018-06-01 ENCOUNTER — Other Ambulatory Visit: Payer: Self-pay

## 2018-06-01 ENCOUNTER — Encounter (HOSPITAL_BASED_OUTPATIENT_CLINIC_OR_DEPARTMENT_OTHER): Payer: Self-pay | Admitting: Emergency Medicine

## 2018-06-01 DIAGNOSIS — S3992XA Unspecified injury of lower back, initial encounter: Secondary | ICD-10-CM | POA: Diagnosis present

## 2018-06-01 DIAGNOSIS — Y92410 Unspecified street and highway as the place of occurrence of the external cause: Secondary | ICD-10-CM | POA: Insufficient documentation

## 2018-06-01 DIAGNOSIS — S39012D Strain of muscle, fascia and tendon of lower back, subsequent encounter: Secondary | ICD-10-CM

## 2018-06-01 DIAGNOSIS — Y9389 Activity, other specified: Secondary | ICD-10-CM | POA: Diagnosis not present

## 2018-06-01 DIAGNOSIS — S39012A Strain of muscle, fascia and tendon of lower back, initial encounter: Secondary | ICD-10-CM | POA: Insufficient documentation

## 2018-06-01 DIAGNOSIS — Y998 Other external cause status: Secondary | ICD-10-CM | POA: Diagnosis not present

## 2018-06-01 DIAGNOSIS — Z87891 Personal history of nicotine dependence: Secondary | ICD-10-CM | POA: Insufficient documentation

## 2018-06-01 DIAGNOSIS — J45909 Unspecified asthma, uncomplicated: Secondary | ICD-10-CM | POA: Insufficient documentation

## 2018-06-01 MED ORDER — CYCLOBENZAPRINE HCL 10 MG PO TABS: 10.0000 mg | ORAL_TABLET | Freq: Two times a day (BID) | ORAL | 0 refills | Status: DC | PRN

## 2018-06-01 MED ORDER — NAPROXEN 500 MG PO TABS: 500.0000 mg | ORAL_TABLET | Freq: Two times a day (BID) | ORAL | 0 refills | Status: DC

## 2018-06-01 NOTE — Discharge Instructions (Signed)
Your back pain should be treated with medicines such as ibuprofen or tylenol and this back pain should get better over the next 2 weeks.   Follow Up: Please follow up with your primary healthcare provider in 1-2 weeks for reassessment. if you do not have a primary care doctor use the resource guide provided to find one.  Low back pain is discomfort in the lower back that may be due to injuries to muscles and ligaments around the spine. Occasionally, it may be caused by a a problem to a part of the spine called a disc. The pain may last several days or a week;  However, most patients get completely well in 4 weeks.   1. Medications: Prescription sent to your pharmacy for naproxen.  This is an anti-inflammatory, please take as prescribed.  You can also take Tylenol if needed. Do not exceed 4000 mg of Tylenol daily.  Take ibuprofen with food to avoid upset stomach issues.   Muscle relaxants:  These medications can help with muscle tightness that is a cause of lower back pain. Most of these medications can cause drowsiness, and it is not safe to drive or use dangerous machinery while taking them.You can take Flexeril as needed for muscle spasm up to twice daily but do not drive, drink alcohol, or operate heavy machinery while taking this medicine because it may make you drowsy.  I typically recommend taking this medicine only at night when you are going to sleep.  You can also cut these tablets in half if they make you feel very drowsy.  2. Treatment: rest, drink plenty of fluids, gentle stretching as discussed (see attached), alternate ice and heat (or stick with whichever feels best) 20 minutes on 20 minutes off. Maintaining your daily activities, including walking, is encourged, as it will help you get better faster than just staying in bed.    Be aware that if you develop new symptoms, such as a fever, leg weakness, difficulty with or loss of control of your urine or bowels, abdominal pain, or more  severe pain, you will need to seek medical attention immediately and  / or return to the Emergency department.

## 2018-06-01 NOTE — ED Notes (Signed)
Urine collected at triage.
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
## 2018-06-10 ENCOUNTER — Other Ambulatory Visit: Payer: Self-pay

## 2018-06-10 ENCOUNTER — Encounter (HOSPITAL_BASED_OUTPATIENT_CLINIC_OR_DEPARTMENT_OTHER): Payer: Self-pay | Admitting: Emergency Medicine

## 2018-06-10 ENCOUNTER — Emergency Department (HOSPITAL_BASED_OUTPATIENT_CLINIC_OR_DEPARTMENT_OTHER)
Admission: EM | Admit: 2018-06-10 | Discharge: 2018-06-10 | Disposition: A | Discharge status: Home / Self Care | Payer: Medicaid Other | Attending: Emergency Medicine | Admitting: Emergency Medicine

## 2018-06-10 DIAGNOSIS — B029 Zoster without complications: Secondary | ICD-10-CM | POA: Insufficient documentation

## 2018-06-10 DIAGNOSIS — M545 Low back pain, unspecified: Secondary | ICD-10-CM

## 2018-06-10 DIAGNOSIS — Z87891 Personal history of nicotine dependence: Secondary | ICD-10-CM | POA: Insufficient documentation

## 2018-06-10 DIAGNOSIS — J45909 Unspecified asthma, uncomplicated: Secondary | ICD-10-CM | POA: Insufficient documentation

## 2018-06-10 MED ORDER — CYCLOBENZAPRINE HCL 10 MG PO TABS: 10.0000 mg | ORAL_TABLET | Freq: Two times a day (BID) | ORAL | 0 refills | Status: DC | PRN

## 2018-06-10 MED ORDER — HYDROXYZINE HCL 25 MG PO TABS: 25.0000 mg | ORAL_TABLET | Freq: Three times a day (TID) | ORAL | 0 refills | Status: DC | PRN

## 2018-06-10 MED ORDER — NAPROXEN 500 MG PO TABS: 500.0000 mg | ORAL_TABLET | Freq: Two times a day (BID) | ORAL | 0 refills | Status: DC
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
## 2018-06-17 ENCOUNTER — Encounter: Payer: Self-pay | Admitting: Family Medicine

## 2018-06-17 ENCOUNTER — Other Ambulatory Visit: Payer: Self-pay

## 2018-06-17 ENCOUNTER — Ambulatory Visit (HOSPITAL_BASED_OUTPATIENT_CLINIC_OR_DEPARTMENT_OTHER)
Admission: RE | Admit: 2018-06-17 | Discharge: 2018-06-17 | Disposition: A | Discharge status: Home / Self Care | Payer: Medicaid Other | Source: Ambulatory Visit | Attending: Family Medicine | Admitting: Family Medicine

## 2018-06-17 ENCOUNTER — Ambulatory Visit (INDEPENDENT_AMBULATORY_CARE_PROVIDER_SITE_OTHER): Payer: Self-pay | Admitting: Family Medicine

## 2018-06-17 VITALS — BP 106/72 | HR 84 | Ht 65.0 in | Wt 134.4 lb

## 2018-06-17 DIAGNOSIS — M545 Low back pain, unspecified: Secondary | ICD-10-CM

## 2018-06-17 MED ORDER — PREDNISONE 10 MG PO TABS: ORAL_TABLET | ORAL | 0 refills | Status: DC

## 2018-06-17 NOTE — Patient Instructions (Signed)
Get x-rays downstairs as you leave today. You have a lumbar strain. Ok to take tylenol for baseline pain relief (1-2 extra strength tabs 3x/day) Take prednisone dose pack as directed x 6 days. Do NOT take naproxen with prednisone but you can restart this the day after you finish prednisone Flexeril as needed for muscle spasms (no driving on this medicine if it makes you sleepy). Stay as active as possible. Do home exercises and stretches as directed - hold each for 20-30 seconds and do each one three times. Start physical therapy as well. Strengthening of low back muscles, abdominal musculature are key for long term pain relief. If not improving, will consider further imaging (MRI). Follow up with me in 1 month.

## 2018-06-18 ENCOUNTER — Encounter: Payer: Self-pay | Admitting: Family Medicine
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
## 2018-06-24 ENCOUNTER — Telehealth: Payer: Self-pay | Admitting: Family Medicine

## 2018-06-24 ENCOUNTER — Ambulatory Visit: Payer: Medicaid Other | Admitting: Physical Therapy

## 2018-06-24 MED ORDER — CYCLOBENZAPRINE HCL 10 MG PO TABS: 10.0000 mg | ORAL_TABLET | Freq: Three times a day (TID) | ORAL | 1 refills | Status: DC | PRN

## 2018-06-24 MED ORDER — NAPROXEN 500 MG PO TABS: 500.0000 mg | ORAL_TABLET | Freq: Two times a day (BID) | ORAL | 2 refills | Status: DC

## 2018-06-24 NOTE — Telephone Encounter (Signed)
Patient has finished prednisone course and is requesting a refill of naproxen and flexeril   Pharmacy: [facility] on [address] in [address]

## 2018-06-24 NOTE — Telephone Encounter (Signed)
Ok sent both in for her - thanks!

## 2018-06-24 NOTE — Telephone Encounter (Signed)
Left detailed message informing patient

## 2018-07-01 ENCOUNTER — Encounter: Payer: Self-pay | Admitting: Physical Therapy

## 2018-07-01 ENCOUNTER — Ambulatory Visit: Payer: Medicaid Other | Attending: Family Medicine | Admitting: Physical Therapy

## 2018-07-01 ENCOUNTER — Other Ambulatory Visit: Payer: Self-pay

## 2018-07-01 DIAGNOSIS — R293 Abnormal posture: Secondary | ICD-10-CM | POA: Insufficient documentation

## 2018-07-01 DIAGNOSIS — R29898 Other symptoms and signs involving the musculoskeletal system: Secondary | ICD-10-CM | POA: Diagnosis present

## 2018-07-01 DIAGNOSIS — M545 Low back pain, unspecified: Secondary | ICD-10-CM

## 2018-07-01 DIAGNOSIS — M6281 Muscle weakness (generalized): Secondary | ICD-10-CM | POA: Diagnosis present
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
## 2018-07-08 ENCOUNTER — Ambulatory Visit: Payer: Medicaid Other | Admitting: Physical Therapy

## 2018-07-16 ENCOUNTER — Ambulatory Visit: Payer: Medicaid Other | Admitting: Physical Therapy

## 2018-07-20 ENCOUNTER — Ambulatory Visit: Payer: Medicaid Other | Admitting: Family Medicine

## 2018-07-20 ENCOUNTER — Ambulatory Visit: Payer: Medicaid Other | Admitting: Physical Therapy

## 2018-07-23 ENCOUNTER — Ambulatory Visit: Payer: Medicaid Other | Admitting: Physical Therapy

## 2018-07-29 ENCOUNTER — Ambulatory Visit (INDEPENDENT_AMBULATORY_CARE_PROVIDER_SITE_OTHER): Payer: Self-pay | Admitting: Family Medicine

## 2018-07-29 ENCOUNTER — Encounter: Payer: Self-pay | Admitting: Family Medicine

## 2018-07-29 ENCOUNTER — Encounter: Payer: Self-pay | Admitting: Physical Therapy

## 2018-07-29 ENCOUNTER — Ambulatory Visit: Payer: Medicaid Other | Attending: Family Medicine | Admitting: Physical Therapy

## 2018-07-29 ENCOUNTER — Other Ambulatory Visit: Payer: Self-pay

## 2018-07-29 VITALS — BP 106/71 | HR 77 | Temp 97.8°F | Ht 65.0 in | Wt 135.0 lb

## 2018-07-29 DIAGNOSIS — M545 Low back pain, unspecified: Secondary | ICD-10-CM

## 2018-07-29 DIAGNOSIS — R29898 Other symptoms and signs involving the musculoskeletal system: Secondary | ICD-10-CM | POA: Insufficient documentation

## 2018-07-29 DIAGNOSIS — R293 Abnormal posture: Secondary | ICD-10-CM | POA: Insufficient documentation

## 2018-07-29 DIAGNOSIS — M6281 Muscle weakness (generalized): Secondary | ICD-10-CM | POA: Insufficient documentation
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
## 2018-07-29 NOTE — Patient Instructions (Signed)
We will go ahead with an MRI of your lumbar spine to further assess. You can continue the naproxen, flexeril, and physical therapy in meantime - I wouldn't make and adjustments until we get the MRI results as these would likely change management. Follow up will depend on the test results.
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
## 2018-08-05 ENCOUNTER — Ambulatory Visit: Payer: Medicaid Other

## 2018-08-12 ENCOUNTER — Ambulatory Visit: Payer: No Typology Code available for payment source | Attending: Family Medicine | Admitting: Physical Therapy

## 2018-08-13 ENCOUNTER — Ambulatory Visit: Payer: No Typology Code available for payment source | Admitting: Physical Therapy

## 2018-08-18 ENCOUNTER — Ambulatory Visit: Payer: No Typology Code available for payment source | Admitting: Physical Therapy

## 2018-08-19 NOTE — Addendum Note (Signed)
Addended by: Kathi Simpers F on: 08/19/2018 12:23 PM   Modules accepted: Orders

## 2018-08-26 ENCOUNTER — Telehealth: Payer: Self-pay | Admitting: Family Medicine

## 2018-08-26 ENCOUNTER — Other Ambulatory Visit: Payer: Self-pay | Admitting: *Deleted

## 2018-08-26 MED ORDER — CYCLOBENZAPRINE HCL 10 MG PO TABS: 10.0000 mg | ORAL_TABLET | Freq: Three times a day (TID) | ORAL | 0 refills | Status: DC | PRN

## 2018-08-26 NOTE — Telephone Encounter (Signed)
Ok to send in 60 tabs, 0 refills.  Did she end up getting the MRI of her lumbar spine yet?  It looks like it's only ordered in the chart.

## 2018-08-26 NOTE — Telephone Encounter (Signed)
Rx sent in for the Flexeril.

## 2018-08-26 NOTE — Telephone Encounter (Signed)
Requesting refill of Flexeril  Pharmacy: [facility] on [address]

## 2018-08-28 IMAGING — DX DG NECK SOFT TISSUE
1 series · 1 of 1 positions shown · non-contrast
Comparison: None.

CLINICAL DATA: Productive cough and a sore throat

EXAM:
NECK SOFT TISSUES - 1+ VIEW

[neck lat]
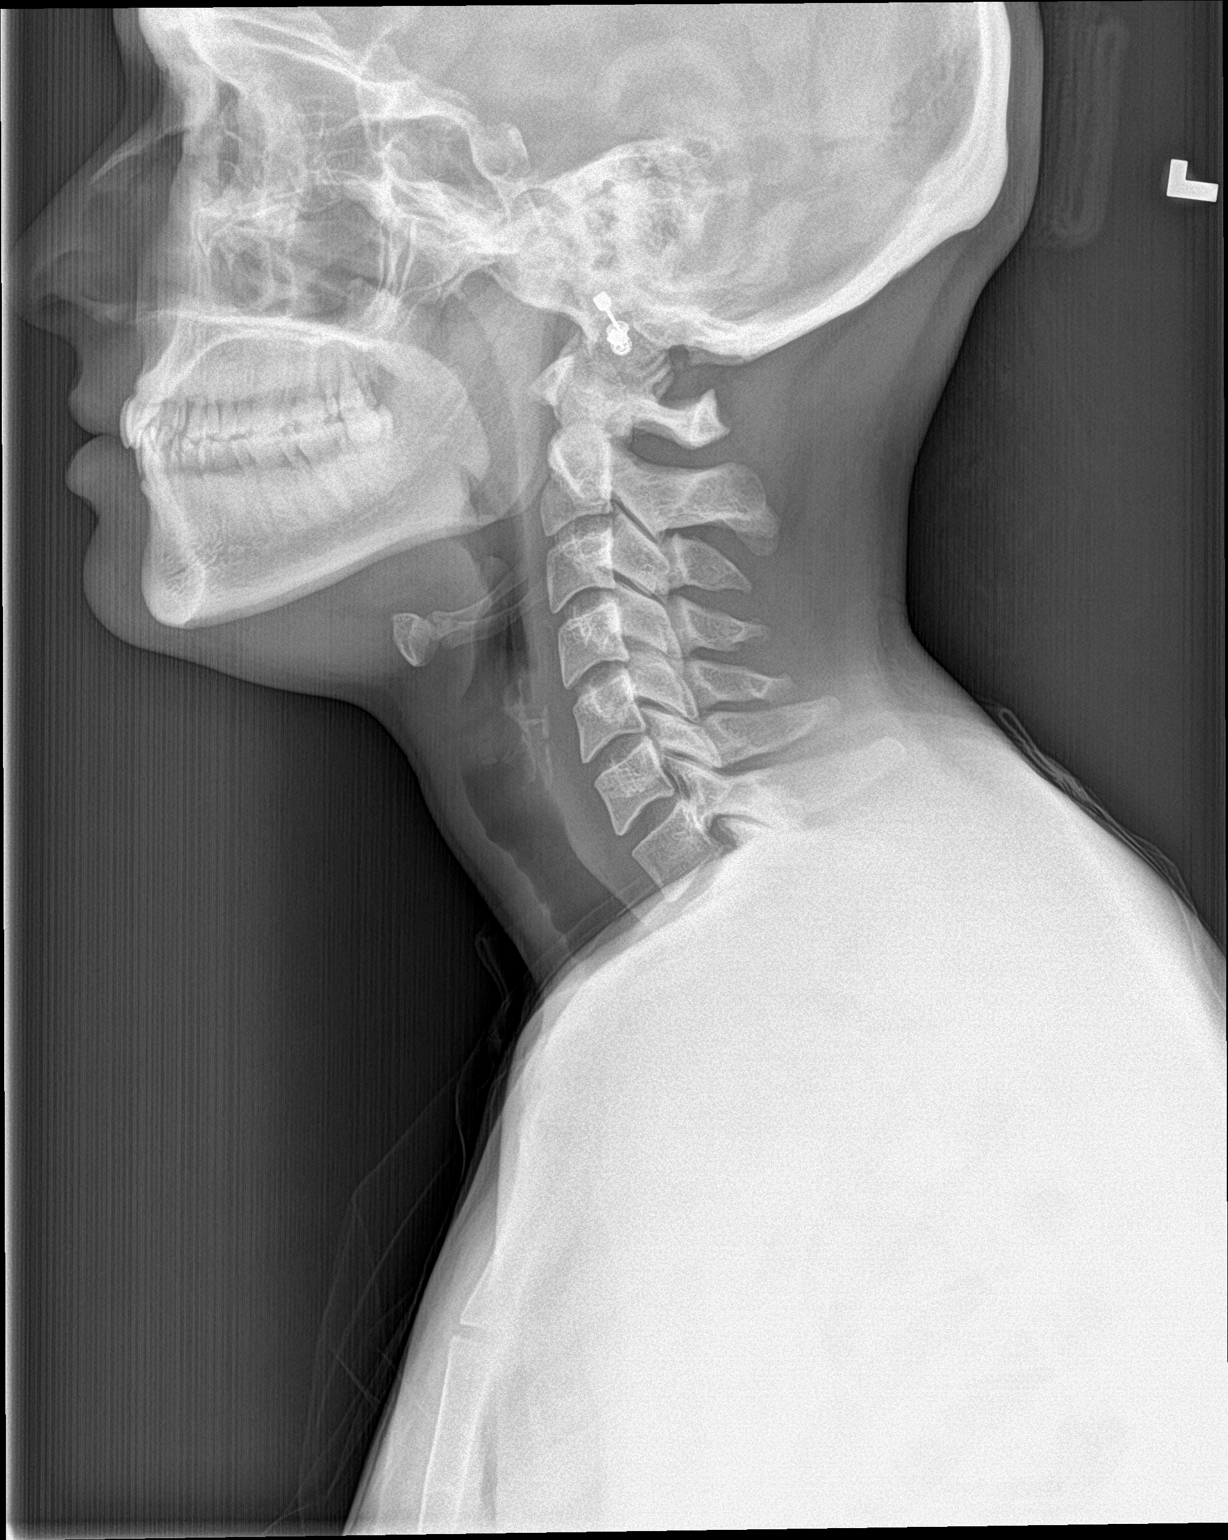

[1 of 1 positions shown; findings below may reference images not displayed]

FINDINGS: There is no evidence of retropharyngeal soft tissue swelling or
epiglottic enlargement. The cervical airway is unremarkable and no
radio-opaque foreign body identified.
IMPRESSION: Negative.

## 2018-09-07 ENCOUNTER — Other Ambulatory Visit: Payer: Self-pay

## 2018-09-07 ENCOUNTER — Telehealth: Payer: Self-pay | Admitting: Family Medicine

## 2018-09-07 ENCOUNTER — Ambulatory Visit: Payer: Medicaid Other | Attending: Family Medicine | Admitting: Physical Therapy

## 2018-09-07 ENCOUNTER — Encounter: Payer: Self-pay | Admitting: Physical Therapy

## 2018-09-07 DIAGNOSIS — M545 Low back pain, unspecified: Secondary | ICD-10-CM

## 2018-09-07 DIAGNOSIS — R293 Abnormal posture: Secondary | ICD-10-CM | POA: Insufficient documentation

## 2018-09-07 DIAGNOSIS — R29898 Other symptoms and signs involving the musculoskeletal system: Secondary | ICD-10-CM

## 2018-09-07 DIAGNOSIS — M6281 Muscle weakness (generalized): Secondary | ICD-10-CM | POA: Insufficient documentation

## 2018-09-07 DIAGNOSIS — G8929 Other chronic pain: Secondary | ICD-10-CM

## 2018-09-07 MED ORDER — NAPROXEN 500 MG PO TABS: 500.0000 mg | ORAL_TABLET | Freq: Two times a day (BID) | ORAL | 0 refills | Status: DC | PRN

## 2018-09-07 NOTE — Telephone Encounter (Signed)
Sent in for patient

## 2018-09-07 NOTE — Telephone Encounter (Signed)
Patient was informed.

## 2018-09-07 NOTE — Telephone Encounter (Signed)
Requesting refill of Naproxen  Pharmacy: [facility] on [address]
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
## 2018-09-14 ENCOUNTER — Ambulatory Visit: Payer: Medicaid Other | Admitting: Physical Therapy

## 2018-09-21 ENCOUNTER — Ambulatory Visit: Payer: Medicaid Other

## 2018-09-21 ENCOUNTER — Other Ambulatory Visit: Payer: Self-pay

## 2018-09-21 ENCOUNTER — Ambulatory Visit (INDEPENDENT_AMBULATORY_CARE_PROVIDER_SITE_OTHER): Payer: Medicaid Other | Admitting: Family Medicine

## 2018-09-21 ENCOUNTER — Encounter: Payer: Self-pay | Admitting: Family Medicine

## 2018-09-21 VITALS — BP 113/74 | HR 93 | Ht 64.0 in | Wt 135.0 lb

## 2018-09-21 DIAGNOSIS — G8929 Other chronic pain: Secondary | ICD-10-CM

## 2018-09-21 DIAGNOSIS — M545 Low back pain, unspecified: Secondary | ICD-10-CM

## 2018-09-21 DIAGNOSIS — R293 Abnormal posture: Secondary | ICD-10-CM

## 2018-09-21 DIAGNOSIS — M6281 Muscle weakness (generalized): Secondary | ICD-10-CM

## 2018-09-21 DIAGNOSIS — R29898 Other symptoms and signs involving the musculoskeletal system: Secondary | ICD-10-CM

## 2018-09-21 NOTE — Patient Instructions (Signed)
Physical therapy is the most important part of your treatment. Continue this and home exercises on days you don't go to therapy. Follow up with me in 6 weeks or as needed.
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
## 2018-09-23 ENCOUNTER — Telehealth: Payer: Self-pay | Admitting: Family Medicine

## 2018-09-23 NOTE — Telephone Encounter (Signed)
Patient states she is in a lot of pain and is asking if any other medication can be prescribed.

## 2018-09-23 NOTE — Telephone Encounter (Signed)
Spoke to patient and she would like to try the diclofenac. Please send to [facility] on [address].

## 2018-09-23 NOTE — Telephone Encounter (Signed)
If the naproxen isn't working well enough we could try something stronger instead like diclofenac twice a day.  Let me know and we can send it in.  Thanks!

## 2018-09-24 MED ORDER — DICLOFENAC SODIUM 75 MG PO TBEC: 75.0000 mg | DELAYED_RELEASE_TABLET | Freq: Two times a day (BID) | ORAL | 1 refills | Status: DC

## 2018-09-24 NOTE — Telephone Encounter (Signed)
Ok sent this in - let her know and that she should not take meloxicam, aleve, or ibuprofen while on it.  Thanks!

## 2018-09-24 NOTE — Telephone Encounter (Signed)
Patient was informed and verbalized understanding.

## 2018-09-28 ENCOUNTER — Ambulatory Visit: Payer: Medicaid Other | Admitting: Physical Therapy

## 2018-09-29 ENCOUNTER — Ambulatory Visit: Payer: Medicaid Other | Admitting: Physical Therapy

## 2018-09-29 ENCOUNTER — Other Ambulatory Visit: Payer: Self-pay

## 2018-09-29 ENCOUNTER — Encounter: Payer: Self-pay | Admitting: Physical Therapy

## 2018-09-29 DIAGNOSIS — R29898 Other symptoms and signs involving the musculoskeletal system: Secondary | ICD-10-CM

## 2018-09-29 DIAGNOSIS — M6281 Muscle weakness (generalized): Secondary | ICD-10-CM

## 2018-09-29 DIAGNOSIS — M545 Low back pain: Secondary | ICD-10-CM | POA: Diagnosis not present

## 2018-09-29 DIAGNOSIS — R293 Abnormal posture: Secondary | ICD-10-CM

## 2018-09-29 DIAGNOSIS — G8929 Other chronic pain: Secondary | ICD-10-CM
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
## 2018-10-06 ENCOUNTER — Ambulatory Visit: Payer: Medicaid Other | Admitting: Physical Therapy

## 2018-10-13 ENCOUNTER — Ambulatory Visit: Payer: Medicaid Other | Attending: Family Medicine

## 2018-10-13 ENCOUNTER — Other Ambulatory Visit: Payer: Self-pay

## 2018-10-13 DIAGNOSIS — R29898 Other symptoms and signs involving the musculoskeletal system: Secondary | ICD-10-CM | POA: Insufficient documentation

## 2018-10-13 DIAGNOSIS — M6281 Muscle weakness (generalized): Secondary | ICD-10-CM | POA: Diagnosis present

## 2018-10-13 DIAGNOSIS — M545 Low back pain: Secondary | ICD-10-CM | POA: Insufficient documentation

## 2018-10-13 DIAGNOSIS — R293 Abnormal posture: Secondary | ICD-10-CM | POA: Diagnosis present

## 2018-10-13 DIAGNOSIS — G8929 Other chronic pain: Secondary | ICD-10-CM | POA: Insufficient documentation
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
## 2018-10-19 ENCOUNTER — Encounter: Payer: Medicaid Other | Admitting: Physical Therapy

## 2018-10-20 ENCOUNTER — Other Ambulatory Visit: Payer: Self-pay

## 2018-10-20 ENCOUNTER — Encounter: Payer: Self-pay | Admitting: Physical Therapy

## 2018-10-20 ENCOUNTER — Ambulatory Visit: Payer: Medicaid Other | Admitting: Physical Therapy

## 2018-10-20 DIAGNOSIS — M6281 Muscle weakness (generalized): Secondary | ICD-10-CM

## 2018-10-20 DIAGNOSIS — M545 Low back pain: Secondary | ICD-10-CM | POA: Diagnosis not present

## 2018-10-20 DIAGNOSIS — G8929 Other chronic pain: Secondary | ICD-10-CM

## 2018-10-20 DIAGNOSIS — R293 Abnormal posture: Secondary | ICD-10-CM

## 2018-10-20 DIAGNOSIS — R29898 Other symptoms and signs involving the musculoskeletal system: Secondary | ICD-10-CM
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
## 2018-10-27 ENCOUNTER — Ambulatory Visit: Payer: Medicaid Other

## 2018-11-02 ENCOUNTER — Other Ambulatory Visit: Payer: Self-pay

## 2018-11-02 ENCOUNTER — Encounter: Payer: Self-pay | Admitting: Family Medicine

## 2018-11-02 ENCOUNTER — Ambulatory Visit (INDEPENDENT_AMBULATORY_CARE_PROVIDER_SITE_OTHER): Payer: Self-pay | Admitting: Family Medicine

## 2018-11-02 DIAGNOSIS — M545 Low back pain, unspecified: Secondary | ICD-10-CM | POA: Insufficient documentation

## 2018-11-02 DIAGNOSIS — G8929 Other chronic pain: Secondary | ICD-10-CM

## 2018-11-02 NOTE — Assessment & Plan Note (Signed)
Pain seems more related to the lower back with no sciatic symptoms today.  Has not had improvement with gabapentin or muscle relaxer.  Has been through physical therapy with some improvement. -We will continue physical therapy for now. -Would consider amitriptyline or nortriptyline or facet injections. -Counseled on home exercise therapy and supportive care. -Has followed up in 4 to 6 weeks

## 2018-11-02 NOTE — Progress Notes (Signed)
Maureen Russell - 28 y.o. female MRN [id number]  Date of birth: 09/18/1990  SUBJECTIVE:  Including CC & ROS.  Chief Complaint  Patient presents with  . Follow-up    follow up for low back    Maureen Russell is a 28 y.o. female that is following up for low back pain.  The pain is ongoing since January.  She currently does not have any pain today.  She has been in physical therapy was noted some improvement.  Has not had any improvement of gabapentin or muscle relaxer.  Pain is intermittent in nature.  She feels in the lower part of her back with no radicular symptoms.  Denies any weakness.  No saddle anesthesia or urinary incontinence.  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  Independent review of the lumbar spine x-ray from 3/11 shows no significant abnormalities.   Review of Systems  Constitutional: Negative for fever.  HENT: Negative for congestion.   Respiratory: Negative for cough.   Cardiovascular: Negative for chest pain.  Gastrointestinal: Negative for abdominal pain.  Musculoskeletal: Positive for back pain.  Skin: Negative for color change.  Neurological: Negative for weakness.  Hematological: Negative for adenopathy.    HISTORY: Past Medical, Surgical, Social, and Family History Reviewed & Updated per EMR.   Pertinent Historical Findings include:  Past Medical History:  Diagnosis Date  . Asthma   . Bronchitis   . Herpes   . Thyroid disease     Past Surgical History:  Procedure Laterality Date  . CESAREAN SECTION    . TONSILLECTOMY    . TUBAL LIGATION      No Known Allergies  No family history on file.   Social History   Socioeconomic History  . Marital status: Single    Spouse name: Not on file  . Number of children: Not on file  . Years of education: Not on file  . Highest education level: Not on file  Occupational History  . Not on file  Social Needs  . Financial resource strain: Not on file  . Food insecurity    Worry: Not on file   Inability: Not on file  . Transportation needs    Medical: Not on file    Non-medical: Not on file  Tobacco Use  . Smoking status: Former Games developermoker  . Smokeless tobacco: Never Used  Substance and Sexual Activity  . Alcohol use: No  . Drug use: No  . Sexual activity: Not on file  Lifestyle  . Physical activity    Days per week: Not on file    Minutes per session: Not on file  . Stress: Not on file  Relationships  . Social Musicianconnections    Talks on phone: Not on file    Gets together: Not on file    Attends religious service: Not on file    Active member of club or organization: Not on file    Attends meetings of clubs or organizations: Not on file    Relationship status: Not on file  . Intimate partner violence    Fear of current or ex partner: Not on file    Emotionally abused: Not on file    Physically abused: Not on file    Forced sexual activity: Not on file  Other Topics Concern  . Not on file  Social History Narrative  . Not on file     PHYSICAL EXAM:  VS: BP 118/73   Pulse 86   Ht 5\' 5"  (1.651  m)   Wt 131 lb 3.2 oz (59.5 kg)   BMI 21.83 kg/m  Physical Exam Gen: NAD, alert, cooperative with exam, well-appearing ENT: normal lips, normal nasal mucosa,  Eye: normal EOM, normal conjunctiva and lids CV:  no edema, +2 pedal pulses   Resp: no accessory muscle use, non-labored,  Skin: no rashes, no areas of induration  Neuro: normal tone, normal sensation to touch Psych:  normal insight, alert and oriented MSK:  Back: Normal flexion extension. Normal strength resistance with hip flexion. Normal internal and external rotation of the hips. Normal strength resistance with plantarflexion and dorsiflexion. Negative straight leg raise. Neurovascular intact     ASSESSMENT & PLAN:   Low back pain Pain seems more related to the lower back with no sciatic symptoms today.  Has not had improvement with gabapentin or muscle relaxer.  Has been through physical therapy  with some improvement. -We will continue physical therapy for now. -Would consider amitriptyline or nortriptyline or facet injections. -Counseled on home exercise therapy and supportive care. -Has followed up in 4 to 6 weeks

## 2018-11-02 NOTE — Patient Instructions (Signed)
Nice to meet you Please continue with the physical therapy  Pleas try heat on the lower back  Please try Aspercreme with lidocaine. Please try massage. Please send me a message in MyChart with any questions or updates.  Please see me back in 4-6 weeks.   --Dr. Raeford Razor

## 2018-11-03 ENCOUNTER — Ambulatory Visit: Payer: Medicaid Other

## 2018-11-03 DIAGNOSIS — R29898 Other symptoms and signs involving the musculoskeletal system: Secondary | ICD-10-CM

## 2018-11-03 DIAGNOSIS — R293 Abnormal posture: Secondary | ICD-10-CM

## 2018-11-03 DIAGNOSIS — M545 Low back pain, unspecified: Secondary | ICD-10-CM

## 2018-11-03 DIAGNOSIS — G8929 Other chronic pain: Secondary | ICD-10-CM

## 2018-11-03 DIAGNOSIS — M6281 Muscle weakness (generalized): Secondary | ICD-10-CM
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
## 2018-11-09 ENCOUNTER — Ambulatory Visit: Payer: Medicaid Other | Admitting: Physical Therapy

## 2018-11-10 ENCOUNTER — Ambulatory Visit: Payer: Medicaid Other | Admitting: Physical Therapy

## 2018-11-13 ENCOUNTER — Ambulatory Visit: Payer: Medicaid Other | Attending: Family Medicine | Admitting: Physical Therapy

## 2018-11-13 ENCOUNTER — Encounter: Payer: Self-pay | Admitting: Physical Therapy

## 2018-11-13 ENCOUNTER — Other Ambulatory Visit: Payer: Self-pay

## 2018-11-13 DIAGNOSIS — R293 Abnormal posture: Secondary | ICD-10-CM | POA: Diagnosis present

## 2018-11-13 DIAGNOSIS — M545 Low back pain, unspecified: Secondary | ICD-10-CM

## 2018-11-13 DIAGNOSIS — R29898 Other symptoms and signs involving the musculoskeletal system: Secondary | ICD-10-CM | POA: Diagnosis present

## 2018-11-13 DIAGNOSIS — M6281 Muscle weakness (generalized): Secondary | ICD-10-CM | POA: Insufficient documentation

## 2018-11-13 DIAGNOSIS — G8929 Other chronic pain: Secondary | ICD-10-CM | POA: Diagnosis present
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
## 2018-11-19 ENCOUNTER — Ambulatory Visit: Payer: Medicaid Other | Admitting: Physical Therapy

## 2018-11-19 ENCOUNTER — Other Ambulatory Visit: Payer: Self-pay

## 2018-11-19 ENCOUNTER — Encounter: Payer: Self-pay | Admitting: Physical Therapy

## 2018-11-19 DIAGNOSIS — M6281 Muscle weakness (generalized): Secondary | ICD-10-CM

## 2018-11-19 DIAGNOSIS — R29898 Other symptoms and signs involving the musculoskeletal system: Secondary | ICD-10-CM

## 2018-11-19 DIAGNOSIS — G8929 Other chronic pain: Secondary | ICD-10-CM

## 2018-11-19 DIAGNOSIS — M545 Low back pain: Secondary | ICD-10-CM | POA: Diagnosis not present

## 2018-11-19 DIAGNOSIS — R293 Abnormal posture: Secondary | ICD-10-CM
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
## 2018-11-26 ENCOUNTER — Ambulatory Visit: Payer: Medicaid Other

## 2018-11-30 ENCOUNTER — Ambulatory Visit: Payer: Medicaid Other | Admitting: Family Medicine

## 2018-12-03 ENCOUNTER — Ambulatory Visit: Payer: Medicaid Other

## 2018-12-03 ENCOUNTER — Ambulatory Visit (INDEPENDENT_AMBULATORY_CARE_PROVIDER_SITE_OTHER): Payer: Self-pay | Admitting: Family Medicine

## 2018-12-03 ENCOUNTER — Other Ambulatory Visit: Payer: Self-pay

## 2018-12-03 DIAGNOSIS — G8929 Other chronic pain: Secondary | ICD-10-CM

## 2018-12-03 DIAGNOSIS — M545 Low back pain, unspecified: Secondary | ICD-10-CM

## 2018-12-03 DIAGNOSIS — R29898 Other symptoms and signs involving the musculoskeletal system: Secondary | ICD-10-CM

## 2018-12-03 DIAGNOSIS — R293 Abnormal posture: Secondary | ICD-10-CM

## 2018-12-03 DIAGNOSIS — M6281 Muscle weakness (generalized): Secondary | ICD-10-CM

## 2018-12-03 MED ORDER — CYCLOBENZAPRINE HCL 10 MG PO TABS: 10.0000 mg | ORAL_TABLET | Freq: Three times a day (TID) | ORAL | 0 refills | Status: DC | PRN

## 2018-12-03 NOTE — Patient Instructions (Signed)
Good to see you  Please finish physical therapy Please try heat on the area  Please continue exercises and stretches  Please send me a message in MyChart with any questions or updates.  Please see [place] back in 2-3 months.   --Dr. Raeford Razor
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
## 2018-12-03 NOTE — Assessment & Plan Note (Signed)
Has had an x-ray and MRI completed.  Symptoms seem more muscular in the lower back.  Has done some physical therapy and only has a few more sessions left.  Pain is intermittent at this point. -Can finish physical therapy. -Counseled on home exercise therapy and supportive care. -Refilled muscle relaxer. -Can follow-up in 2 to 3 months.

## 2018-12-03 NOTE — Progress Notes (Signed)
Maureen Russell Karp - 28 y.o. female MRN [id number]  Date of birth: 07/27/1990  SUBJECTIVE:  Including CC & ROS.  No chief complaint on file.   Maureen Russell Harl is a 28 y.o. female that is following up for lower back pain.  The pain is acute on chronic.  It is intermittent in nature.  She has been through physical therapy.  She only has a few more visits left.  She has had an x-ray completed as well as an MRI.  Pain is mild to moderate nature.  Denies any significant radicular symptoms.  Has some improvement with the muscle relaxer.    Review of Systems  Constitutional: Negative for fever.  HENT: Negative for congestion.   Respiratory: Negative for cough.   Cardiovascular: Negative for chest pain.  Gastrointestinal: Negative for abdominal pain.  Musculoskeletal: Positive for back pain.  Neurological: Negative for weakness.  Hematological: Negative for adenopathy.  Psychiatric/Behavioral: Negative for agitation.    HISTORY: Past Medical, Surgical, Social, and Family History Reviewed & Updated per EMR.   Pertinent Historical Findings include:  Past Medical History:  Diagnosis Date  . Asthma   . Bronchitis   . Herpes   . Thyroid disease     Past Surgical History:  Procedure Laterality Date  . CESAREAN SECTION    . TONSILLECTOMY    . TUBAL LIGATION      No Known Allergies  No family history on file.   Social History   Socioeconomic History  . Marital status: Single    Spouse name: Not on file  . Number of children: Not on file  . Years of education: Not on file  . Highest education level: Not on file  Occupational History  . Not on file  Social Needs  . Financial resource strain: Not on file  . Food insecurity    Worry: Not on file    Inability: Not on file  . Transportation needs    Medical: Not on file    Non-medical: Not on file  Tobacco Use  . Smoking status: Former Games developermoker  . Smokeless tobacco: Never Used  Substance and Sexual Activity  . Alcohol use: No   . Drug use: No  . Sexual activity: Not on file  Lifestyle  . Physical activity    Days per week: Not on file    Minutes per session: Not on file  . Stress: Not on file  Relationships  . Social Musicianconnections    Talks on phone: Not on file    Gets together: Not on file    Attends religious service: Not on file    Active member of club or organization: Not on file    Attends meetings of clubs or organizations: Not on file    Relationship status: Not on file  . Intimate partner violence    Fear of current or ex partner: Not on file    Emotionally abused: Not on file    Physically abused: Not on file    Forced sexual activity: Not on file  Other Topics Concern  . Not on file  Social History Narrative  . Not on file     PHYSICAL EXAM:  VS: There were no vitals taken for this visit. Physical Exam Gen: NAD, alert, cooperative with exam, well-appearing ENT: normal lips, normal nasal mucosa,  Eye: normal EOM, normal conjunctiva and lids CV:  no edema, +2 pedal pulses   Resp: no accessory muscle use, non-labored,  Skin: no rashes, no areas  of induration  Neuro: normal tone, normal sensation to touch Psych:  normal insight, alert and oriented MSK:  Back: No tenderness palpation over the paraspinal lumbar muscles. No tenderness palpation of the midline lumbar spine. Some tightness experience with flexion. Normal gait. Neurovascular intact     ASSESSMENT & PLAN:   Low back pain Has had an x-ray and MRI completed.  Symptoms seem more muscular in the lower back.  Has done some physical therapy and only has a few more sessions left.  Pain is intermittent at this point. -Can finish physical therapy. -Counseled on home exercise therapy and supportive care. -Refilled muscle relaxer. -Can follow-up in 2 to 3 months.

## 2018-12-10 ENCOUNTER — Other Ambulatory Visit: Payer: Self-pay

## 2018-12-10 ENCOUNTER — Ambulatory Visit: Payer: Medicaid Other | Attending: Family Medicine | Admitting: Physical Therapy

## 2018-12-10 ENCOUNTER — Encounter: Payer: Self-pay | Admitting: Physical Therapy

## 2018-12-10 DIAGNOSIS — M6281 Muscle weakness (generalized): Secondary | ICD-10-CM | POA: Diagnosis present

## 2018-12-10 DIAGNOSIS — M545 Low back pain: Secondary | ICD-10-CM | POA: Diagnosis present

## 2018-12-10 DIAGNOSIS — R293 Abnormal posture: Secondary | ICD-10-CM | POA: Diagnosis present

## 2018-12-10 DIAGNOSIS — G8929 Other chronic pain: Secondary | ICD-10-CM | POA: Insufficient documentation
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
## 2019-04-21 ENCOUNTER — Encounter: Payer: Self-pay | Admitting: Family Medicine

## 2019-04-26 ENCOUNTER — Other Ambulatory Visit: Payer: Self-pay

## 2019-04-26 ENCOUNTER — Emergency Department (HOSPITAL_BASED_OUTPATIENT_CLINIC_OR_DEPARTMENT_OTHER)
Admission: EM | Admit: 2019-04-26 | Discharge: 2019-04-26 | Disposition: A | Discharge status: Home / Self Care | Payer: Medicaid Other | Attending: Emergency Medicine | Admitting: Emergency Medicine

## 2019-04-26 ENCOUNTER — Encounter (HOSPITAL_BASED_OUTPATIENT_CLINIC_OR_DEPARTMENT_OTHER): Payer: Self-pay

## 2019-04-26 DIAGNOSIS — R1084 Generalized abdominal pain: Secondary | ICD-10-CM | POA: Insufficient documentation

## 2019-04-26 DIAGNOSIS — Z79899 Other long term (current) drug therapy: Secondary | ICD-10-CM | POA: Insufficient documentation

## 2019-04-26 DIAGNOSIS — R112 Nausea with vomiting, unspecified: Secondary | ICD-10-CM | POA: Insufficient documentation

## 2019-04-26 DIAGNOSIS — J45909 Unspecified asthma, uncomplicated: Secondary | ICD-10-CM | POA: Diagnosis not present

## 2019-04-26 LAB — COMPREHENSIVE METABOLIC PANEL
ALT: 15 U/L (ref 0–44)
AST: 21 U/L (ref 15–41)
Albumin: 4.8 g/dL (ref 3.5–5.0)
Alkaline Phosphatase: 44 U/L (ref 38–126)
Anion gap: 10 (ref 5–15)
BUN: 17 mg/dL (ref 6–20)
CO2: 28 mmol/L (ref 22–32)
Calcium: 9.9 mg/dL (ref 8.9–10.3)
Chloride: 97 mmol/L — ABNORMAL LOW (ref 98–111)
Creatinine, Ser: 0.94 mg/dL (ref 0.44–1.00)
GFR calc Af Amer: >60 mL/min (ref >60)
GFR calc non Af Amer: >60 mL/min (ref >60)
Glucose, Bld: 124 mg/dL — ABNORMAL HIGH (ref 70–99)
Potassium: 3.9 mmol/L (ref 3.5–5.1)
Sodium: 135 mmol/L (ref 135–145)
Total Bilirubin: 1.1 mg/dL (ref 0.3–1.2)
Total Protein: 8 g/dL (ref 6.5–8.1)

## 2019-04-26 LAB — CBC WITH DIFFERENTIAL/PLATELET
Abs Immature Granulocytes: 0.01 10*3/uL (ref 0.00–0.07)
Basophils Absolute: 0 10*3/uL (ref 0.0–0.1)
Basophils Relative: 1 %
Eosinophils Absolute: 0 10*3/uL (ref 0.0–0.5)
Eosinophils Relative: 0 %
HCT: 36.6 % (ref 36.0–46.0)
Hemoglobin: 11.9 g/dL — ABNORMAL LOW (ref 12.0–15.0)
Immature Granulocytes: 0 %
Lymphocytes Relative: 15 %
Lymphs Abs: 1 10*3/uL (ref 0.7–4.0)
MCH: 29 pg (ref 26.0–34.0)
MCHC: 32.5 g/dL (ref 30.0–36.0)
MCV: 89.1 fL (ref 80.0–100.0)
Monocytes Absolute: 0.4 10*3/uL (ref 0.1–1.0)
Monocytes Relative: 5 %
Neutro Abs: 5.1 10*3/uL (ref 1.7–7.7)
Neutrophils Relative %: 79 %
Platelets: 178 10*3/uL (ref 150–400)
RBC: 4.11 MIL/uL (ref 3.87–5.11)
RDW: 12.7 % (ref 11.5–15.5)
WBC: 6.5 10*3/uL (ref 4.0–10.5)
nRBC: 0 % (ref 0.0–0.2)

## 2019-04-26 LAB — URINALYSIS, ROUTINE W REFLEX MICROSCOPIC
Bilirubin Urine: NEGATIVE
Glucose, UA: NEGATIVE mg/dL
Hgb urine dipstick: NEGATIVE
Ketones, ur: 15 mg/dL — AB
Leukocytes,Ua: NEGATIVE
Nitrite: NEGATIVE
Protein, ur: NEGATIVE mg/dL
Specific Gravity, Urine: 1.025 (ref 1.005–1.030)
pH: 5.5 (ref 5.0–8.0)

## 2019-04-26 LAB — LIPASE, BLOOD: Lipase: 16 U/L (ref 11–51)

## 2019-04-26 LAB — PREGNANCY, URINE: Preg Test, Ur: NEGATIVE

## 2019-04-26 MED ORDER — ONDANSETRON HCL 4 MG/2ML IJ SOLN
4.0000 mg | Freq: Once | INTRAMUSCULAR | Status: AC
  Administered 2019-04-26: 11:00:00 4 mg via INTRAVENOUS
  Filled 2019-04-26: qty 2

## 2019-04-26 MED ORDER — SODIUM CHLORIDE 0.9 % IV BOLUS
1000.0000 mL | Freq: Once | INTRAVENOUS | Status: AC
  Administered 2019-04-26: 1000 mL via INTRAVENOUS

## 2019-04-26 MED ORDER — DICYCLOMINE HCL 20 MG PO TABS: 20.0000 mg | ORAL_TABLET | Freq: Two times a day (BID) | ORAL | 0 refills | Status: DC

## 2019-04-26 MED ORDER — ONDANSETRON 4 MG PO TBDP: 4.0000 mg | ORAL_TABLET | Freq: Three times a day (TID) | ORAL | 0 refills | Status: DC | PRN

## 2019-04-26 NOTE — ED Notes (Signed)
Pt states she unable to void

## 2019-04-26 NOTE — ED Triage Notes (Signed)
Pt arrives POV to ED with active vomiting in waiting room. Pt reports she thinks it may be something she ate or new medication that she started. Pt reports she has been feeling weak since last night.

## 2019-04-26 NOTE — Discharge Instructions (Addendum)
Take Zofran for nausea/vomiting.  You can take Bentyl for abdominal pain.  As we discussed, closely monitor your symptoms.    Return the emergency department for any worsening pain, if you start having pain in your right lower abdomen, fevers, inability eat or drink anything, vomiting or any other worsening or concerning symptoms.

## 2019-04-26 NOTE — ED Provider Notes (Signed)
[facility] EMERGENCY DEPARTMENT Provider Note   CSN: [id number] Arrival date & time: 04/26/19  1017     History Chief Complaint  Patient presents with  . Emesis    Maureen Russell is a 29 y.o. female who presents for evaluation of generalized weakness, vomiting that began yesterday.  Patient reports that she was recently started on tramadol and other medication for her back pain.  She reports that shortly after she took it, she started to have nausea/vomiting and just felt generally weak.  She reports she has been able to keep water down but otherwise has not been able to tolerate any other p.o.  Emesis is nonbloody, nonbilious.  Patient states she just feels weak and fatigued.  She reports some generalized abdominal pain that began after vomiting.  She states she did not eat any abnormal foods.  No other person in the house has the symptoms.  She denies any fevers, chest pain, difficulty breathing, dysuria, hematuria.  The history is provided by the patient.       Past Medical History:  Diagnosis Date  . Asthma   . Bronchitis   . Herpes   . Thyroid disease     Patient Active Problem List   Diagnosis Date Noted  . Low back pain 11/02/2018    Past Surgical History:  Procedure Laterality Date  . CESAREAN SECTION    . TONSILLECTOMY    . TUBAL LIGATION       OB History   No obstetric history on file.     No family history on file.  Social History   Tobacco Use  . Smoking status: Former Games developer  . Smokeless tobacco: Never Used  Substance Use Topics  . Alcohol use: No  . Drug use: No    Home Medications Prior to Admission medications   Medication Sig Start Date End Date Taking? Authorizing Provider  cyclobenzaprine (FLEXERIL) 10 MG tablet Take 1 tablet (10 mg total) by mouth 3 (three) times daily as needed for muscle spasms. 12/03/18   Myra Rude, MD  diclofenac (VOLTAREN) 75 MG EC tablet Take 1 tablet (75 mg total) by mouth 2 (two) times daily.  09/24/18   Hudnall, Azucena Fallen, MD  dicyclomine (BENTYL) 20 MG tablet Take 1 tablet (20 mg total) by mouth 2 (two) times daily. 04/26/19   Maxwell Caul, PA-C  gabapentin (NEURONTIN) 300 MG capsule TK 1 C PO BID 07/03/18   [provider]  hydrOXYzine (ATARAX/VISTARIL) 25 MG tablet Take 1 tablet (25 mg total) by mouth every 8 (eight) hours as needed for itching. 06/10/18   Rolan Bucco, MD  levothyroxine (SYNTHROID) 100 MCG tablet TK 7 TS PO Q WEEK OR AS DIRECTED 07/03/18   [provider]  naproxen (NAPROSYN) 500 MG tablet Take 1 tablet (500 mg total) by mouth 2 (two) times daily as needed. 09/07/18   Hudnall, Azucena Fallen, MD  ondansetron (ZOFRAN ODT) 4 MG disintegrating tablet Take 1 tablet (4 mg total) by mouth every 8 (eight) hours as needed for nausea or vomiting. 04/26/19   Maxwell Caul, PA-C  topiramate (TOPAMAX) 25 MG tablet Take 25 mg by mouth 2 (two) times daily.    [provider]    Allergies    Patient has no known allergies.  Review of Systems   Review of Systems  Constitutional: Negative for fever.  Respiratory: Negative for cough and shortness of breath.   Cardiovascular: Negative for chest pain.  Gastrointestinal: Positive for  abdominal pain and vomiting. Negative for nausea.  Genitourinary: Negative for dysuria and hematuria.  Neurological: Positive for weakness (generalized). Negative for numbness and headaches.  All other systems reviewed and are negative.   Physical Exam Updated Vital Signs BP 116/74 (BP Location: Right Arm)   Pulse 64   Temp (!) 97.5 F (36.4 C) (Oral)   Resp 16   Ht 5\' 5"  (1.651 m)   Wt 59.9 kg   LMP 03/26/2019   SpO2 100%   BMI 21.97 kg/m   Physical Exam Vitals and nursing note reviewed.  Constitutional:      Appearance: Normal appearance. She is well-developed.  HENT:     Head: Normocephalic and atraumatic.  Eyes:     General: Lids are normal.     Conjunctiva/sclera: Conjunctivae normal.     Pupils: Pupils  are equal, round, and reactive to light.  Cardiovascular:     Rate and Rhythm: Normal rate and regular rhythm.     Pulses: Normal pulses.     Heart sounds: Normal heart sounds. No murmur. No friction rub. No gallop.   Pulmonary:     Effort: Pulmonary effort is normal.     Breath sounds: Normal breath sounds.     Comments: Lungs clear to auscultation bilaterally.  Symmetric chest rise.  No wheezing, rales, rhonchi. Abdominal:     Palpations: Abdomen is soft. Abdomen is not rigid.     Tenderness: There is generalized abdominal tenderness. There is no guarding.     Comments: Abdomen is soft, nondistended.  Mild generalized tenderness.  No rigidity, guarding.  No CVA tenderness noted.  Musculoskeletal:        General: Normal range of motion.     Cervical back: Full passive range of motion without pain.  Skin:    General: Skin is warm and dry.     Capillary Refill: Capillary refill takes less than 2 seconds.  Neurological:     Mental Status: She is alert and oriented to person, place, and time.  Psychiatric:        Speech: Speech normal.     ED Results / Procedures / Treatments   Labs (all labs ordered are listed, but only abnormal results are displayed) Labs Reviewed  COMPREHENSIVE METABOLIC PANEL - Abnormal; Notable for the following components:      Result Value   Chloride 97 (*)    Glucose, Bld 124 (*)    All other components within normal limits  CBC WITH DIFFERENTIAL/PLATELET - Abnormal; Notable for the following components:   Hemoglobin 11.9 (*)    All other components within normal limits  URINALYSIS, ROUTINE W REFLEX MICROSCOPIC - Abnormal; Notable for the following components:   Ketones, ur 15 (*)    All other components within normal limits  PREGNANCY, URINE  LIPASE, BLOOD    EKG None  Radiology No results found.  Procedures Procedures (including critical care time)  Medications Ordered in ED Medications  sodium chloride 0.9 % bolus 1,000 mL (0 mLs  Intravenous Stopped 04/26/19 1314)  ondansetron (ZOFRAN) injection 4 mg (4 mg Intravenous Given 04/26/19 1108)    ED Course  I have reviewed the triage vital signs and the nursing notes.  Pertinent labs & imaging results that were available during my care of the patient were reviewed by me and considered in my medical decision making (see chart for details).    MDM Rules/Calculators/A&P  29 year old female who presents for evaluation of nausea/vomiting, generalized weakness.  Reports that symptoms started after taking tramadol last night.  No fevers, chest pain, difficulty breathing, urinary complaints.  On initial ED arrival, she is afebrile, nontoxic-appearing.  Vital signs are stable.  On exam, she has some generalized tenderness no focal point.  Concern for infectious process versus medication reaction versus pregnancy versus GU etiology.  Plan check labs.  History/physical exam not concerning for appendicitis, diverticulitis, ovarian torsion.  Lipase is unremarkable.  CMP shows normal BUN and creatinine.  CBC without any significant leukocytosis.  Hemoglobin stable at 11.9. Urine pregnancy needed.  UA is negative for any infectious etiology.  Reevaluation.  Patient is resting comfortably on bed.  Repeat abdominal exam is improved.  She saw some mild tenderness periumbilical but no rigidity, guarding.  She has no right lower quadrant tenderness or tenderness noted to McBurney's point.  She has been able to tolerate p.o. in the department on difficulty.  I discussed results with patient.  At this time, her work-up and exam are reassuring.  We engaged in shared decision-making discussed about further treatment here in the emergency department.  Patient would rather go home and continue to monitor symptoms and return if they worsen.  She does not want any further imaging or work-up here in the emergency department.  I feel this is reasonable given her were short of reassuring  work-up and exam.  I discussed with patient if she has any worsening pain, she would need to return emergency department immediately. At this time, patient exhibits no emergent life-threatening condition that require further evaluation in ED or admission. Patient had ample opportunity for questions and discussion. All patient's questions were answered with full understanding. Strict return precautions discussed. Patient expresses understanding and agreement to plan.   Portions of this note were generated with Scientist, clinical (histocompatibility and immunogenetics). Dictation errors may occur despite best attempts at proofreading.  Final Clinical Impression(s) / ED Diagnoses Final diagnoses:  Non-intractable vomiting with nausea, unspecified vomiting type  Generalized abdominal pain    Rx / DC Orders ED Discharge Orders         Ordered    ondansetron (ZOFRAN ODT) 4 MG disintegrating tablet  Every 8 hours PRN     04/26/19 1307    dicyclomine (BENTYL) 20 MG tablet  2 times daily     04/26/19 1307           Rosana Hoes 04/26/19 1648    Long, Arlyss Repress, MD 04/27/19 1312

## 2019-06-30 ENCOUNTER — Emergency Department (HOSPITAL_BASED_OUTPATIENT_CLINIC_OR_DEPARTMENT_OTHER)
Admission: EM | Admit: 2019-06-30 | Discharge: 2019-06-30 | Disposition: A | Discharge status: Home / Self Care | Payer: Medicaid Other | Attending: Emergency Medicine | Admitting: Emergency Medicine

## 2019-06-30 ENCOUNTER — Encounter (HOSPITAL_BASED_OUTPATIENT_CLINIC_OR_DEPARTMENT_OTHER): Payer: Self-pay | Admitting: Emergency Medicine

## 2019-06-30 ENCOUNTER — Other Ambulatory Visit: Payer: Self-pay

## 2019-06-30 ENCOUNTER — Emergency Department (HOSPITAL_BASED_OUTPATIENT_CLINIC_OR_DEPARTMENT_OTHER): Payer: Medicaid Other

## 2019-06-30 DIAGNOSIS — R05 Cough: Secondary | ICD-10-CM | POA: Insufficient documentation

## 2019-06-30 DIAGNOSIS — Z79899 Other long term (current) drug therapy: Secondary | ICD-10-CM | POA: Insufficient documentation

## 2019-06-30 DIAGNOSIS — R079 Chest pain, unspecified: Secondary | ICD-10-CM | POA: Diagnosis not present

## 2019-06-30 DIAGNOSIS — Z87891 Personal history of nicotine dependence: Secondary | ICD-10-CM | POA: Diagnosis not present

## 2019-06-30 DIAGNOSIS — J45909 Unspecified asthma, uncomplicated: Secondary | ICD-10-CM | POA: Insufficient documentation

## 2019-06-30 LAB — BASIC METABOLIC PANEL
Anion gap: 9 (ref 5–15)
BUN: 17 mg/dL (ref 6–20)
CO2: 29 mmol/L (ref 22–32)
Calcium: 9.3 mg/dL (ref 8.9–10.3)
Chloride: 99 mmol/L (ref 98–111)
Creatinine, Ser: 0.89 mg/dL (ref 0.44–1.00)
GFR calc Af Amer: >60 mL/min (ref >60)
GFR calc non Af Amer: >60 mL/min (ref >60)
Glucose, Bld: 87 mg/dL (ref 70–99)
Potassium: 3.6 mmol/L (ref 3.5–5.1)
Sodium: 137 mmol/L (ref 135–145)

## 2019-06-30 LAB — CBC WITH DIFFERENTIAL/PLATELET
Abs Immature Granulocytes: 0.01 10*3/uL (ref 0.00–0.07)
Basophils Absolute: 0 10*3/uL (ref 0.0–0.1)
Basophils Relative: 0 %
Eosinophils Absolute: 0.1 10*3/uL (ref 0.0–0.5)
Eosinophils Relative: 1 %
HCT: 35.1 % — ABNORMAL LOW (ref 36.0–46.0)
Hemoglobin: 11.6 g/dL — ABNORMAL LOW (ref 12.0–15.0)
Immature Granulocytes: 0 %
Lymphocytes Relative: 32 %
Lymphs Abs: 1.9 10*3/uL (ref 0.7–4.0)
MCH: 29.9 pg (ref 26.0–34.0)
MCHC: 33 g/dL (ref 30.0–36.0)
MCV: 90.5 fL (ref 80.0–100.0)
Monocytes Absolute: 0.6 10*3/uL (ref 0.1–1.0)
Monocytes Relative: 10 %
Neutro Abs: 3.4 10*3/uL (ref 1.7–7.7)
Neutrophils Relative %: 57 %
Platelets: 188 10*3/uL (ref 150–400)
RBC: 3.88 MIL/uL (ref 3.87–5.11)
RDW: 14 % (ref 11.5–15.5)
WBC: 6 10*3/uL (ref 4.0–10.5)
nRBC: 0 % (ref 0.0–0.2)

## 2019-06-30 LAB — TROPONIN I (HIGH SENSITIVITY): Troponin I (High Sensitivity): 2 ng/L (ref <18)

## 2019-06-30 MED ORDER — KETOROLAC TROMETHAMINE 15 MG/ML IJ SOLN
15.0000 mg | Freq: Once | INTRAMUSCULAR | Status: AC
  Administered 2019-06-30: 13:00:00 15 mg via INTRAVENOUS
  Filled 2019-06-30: qty 1

## 2019-06-30 NOTE — ED Notes (Signed)
Pt on monitor 

## 2019-06-30 NOTE — ED Provider Notes (Signed)
[facility] EMERGENCY DEPARTMENT Provider Note   CSN: [id number] Arrival date & time: 06/30/19  1050     History Chief Complaint  Patient presents with  . Chest Pain    Maureen Russell is a 29 y.o. female.  Presents to ER with chest pain.  Pain has been relatively intermittent, going on for the last couple months.  Occurs at rest, not associated with exertion.  Had prolonged episode of pain last night and then today having long episode of pain.  No alleviating factors, seems to be aggravated with cough.  No real changes in cough recently.  No fevers.  Nonproductive, nonbloody.  Does not feel short of breath.  No family history of early cardiac disease, no smoking history, no DVT/PE history.  HPI     Past Medical History:  Diagnosis Date  . Asthma   . Bronchitis   . Herpes   . Thyroid disease     Patient Active Problem List   Diagnosis Date Noted  . Low back pain 11/02/2018    Past Surgical History:  Procedure Laterality Date  . CESAREAN SECTION    . TONSILLECTOMY    . TUBAL LIGATION       OB History   No obstetric history on file.     History reviewed. No pertinent family history.  Social History   Tobacco Use  . Smoking status: Former Research scientist (life sciences)  . Smokeless tobacco: Never Used  Substance Use Topics  . Alcohol use: No  . Drug use: No    Home Medications Prior to Admission medications   Medication Sig Start Date End Date Taking? Authorizing Provider  levothyroxine (SYNTHROID) 100 MCG tablet TK 7 TS PO Q WEEK OR AS DIRECTED 07/03/18   [provider]    Allergies    Patient has no known allergies.  Review of Systems   Review of Systems  Constitutional: Negative for chills and fever.  HENT: Negative for ear pain and sore throat.   Eyes: Negative for pain and visual disturbance.  Respiratory: Positive for cough. Negative for shortness of breath.   Cardiovascular: Positive for chest pain. Negative for palpitations.    Gastrointestinal: Negative for abdominal pain and vomiting.  Genitourinary: Negative for dysuria and hematuria.  Musculoskeletal: Negative for arthralgias and back pain.  Skin: Negative for color change and rash.  Neurological: Negative for seizures and syncope.  All other systems reviewed and are negative.   Physical Exam Updated Vital Signs BP 107/74 (BP Location: Left Arm)   Pulse 70   Temp 98.2 F (36.8 C) (Oral)   Resp 17   Ht 5\' 4"  (1.626 m)   Wt 61.1 kg   LMP 06/16/2019   SpO2 100%   BMI 23.12 kg/m   Physical Exam Vitals and nursing note reviewed.  Constitutional:      General: She is not in acute distress.    Appearance: She is well-developed.  HENT:     Head: Normocephalic and atraumatic.  Eyes:     Conjunctiva/sclera: Conjunctivae normal.  Cardiovascular:     Rate and Rhythm: Normal rate and regular rhythm.     Heart sounds: No murmur.  Pulmonary:     Effort: Pulmonary effort is normal. No respiratory distress.     Breath sounds: Normal breath sounds.  Chest:     Comments: Mild tenderness over anterior chest Schuler Abdominal:     Palpations: Abdomen is soft.     Tenderness: There is no abdominal tenderness.  Musculoskeletal:  Cervical back: Neck supple.  Skin:    General: Skin is warm and dry.  Neurological:     Mental Status: She is alert.     ED Results / Procedures / Treatments   Labs (all labs ordered are listed, but only abnormal results are displayed) Labs Reviewed  CBC WITH DIFFERENTIAL/PLATELET - Abnormal; Notable for the following components:      Result Value   Hemoglobin 11.6 (*)    HCT 35.1 (*)    All other components within normal limits  BASIC METABOLIC PANEL  HCG, SERUM, QUALITATIVE  TROPONIN I (HIGH SENSITIVITY)    EKG EKG Interpretation  Date/Time:  Wednesday June 30 2019 11:27:58 EDT Ventricular Rate:  72 PR Interval:    QRS Duration: 81 QT Interval:  376 QTC Calculation: 412 R Axis:   72 Text  Interpretation: Sinus rhythm Borderline short PR interval Confirmed by Marianna Fuss ([id number]) on 06/30/2019 11:31:19 AM   Radiology DG Chest 2 View  Result Date: 06/30/2019 CLINICAL DATA:  Chest pain. EXAM: CHEST - 2 VIEW COMPARISON:  07/10/2017 FINDINGS: Cardiomediastinal contours and hilar structures are normal. Leads overlie the chest mildly limiting assessment. Lungs are clear. No pleural effusion. Visualized skeletal structures are unremarkable. IMPRESSION: Normal chest Electronically Signed   By: Donzetta Kohut M.D.   On: 06/30/2019 12:08    Procedures Procedures (including critical care time)  Medications Ordered in ED Medications  ketorolac (TORADOL) 15 MG/ML injection 15 mg (15 mg Intravenous Given 06/30/19 1247)    ED Course  I have reviewed the triage vital signs and the nursing notes.  Pertinent labs & imaging results that were available during my care of the patient were reviewed by me and considered in my medical decision making (see chart for details).    MDM Rules/Calculators/A&P                      29 year old lady presenting to ER with chest pain.  Here, very well-appearing, stable vital signs.  No cardiac risk factors, EKG without ischemic changes, troponin within normal limits, doubt ACS.  CXR negative.  Given description of symptoms, reproducible on exam, suspect MSK in etiology.  Pain well controlled, discharged and recommended outpatient follow-up with primary doctor.    After the discussed management above, the patient was determined to be safe for discharge.  The patient was in agreement with this plan and all questions regarding their care were answered.  ED return precautions were discussed and the patient will return to the ED with any significant worsening of condition.  Final Clinical Impression(s) / ED Diagnoses Final diagnoses:  Chest pain, unspecified type    Rx / DC Orders ED Discharge Orders    None       Milagros Loll, MD 06/30/19  1538

## 2019-06-30 NOTE — Discharge Instructions (Signed)
Please schedule follow-up appointment with your primary doctor regarding symptoms you are experiencing today.  Recommend Tylenol Motrin as needed for pain control.  Return to ER if you develop worsening pain, difficulty in breathing, fever, or other new concerning symptom.

## 2019-06-30 NOTE — ED Notes (Signed)
ED Provider at bedside. 

## 2019-06-30 NOTE — ED Triage Notes (Signed)
Pt states she has been having intermittent chest pain for a couple of months.  Pain is centralized, non radiating.  Denies fever, sob or cough.  Pain increases with cough.

## 2019-10-30 ENCOUNTER — Emergency Department (HOSPITAL_BASED_OUTPATIENT_CLINIC_OR_DEPARTMENT_OTHER): Payer: Medicaid Other

## 2019-10-30 ENCOUNTER — Encounter (HOSPITAL_BASED_OUTPATIENT_CLINIC_OR_DEPARTMENT_OTHER): Payer: Self-pay | Admitting: Emergency Medicine

## 2019-10-30 ENCOUNTER — Other Ambulatory Visit: Payer: Self-pay

## 2019-10-30 ENCOUNTER — Emergency Department (HOSPITAL_BASED_OUTPATIENT_CLINIC_OR_DEPARTMENT_OTHER)
Admission: EM | Admit: 2019-10-30 | Discharge: 2019-10-30 | Disposition: A | Discharge status: Home / Self Care | Payer: Medicaid Other | Attending: Emergency Medicine | Admitting: Emergency Medicine

## 2019-10-30 DIAGNOSIS — R319 Hematuria, unspecified: Secondary | ICD-10-CM | POA: Insufficient documentation

## 2019-10-30 DIAGNOSIS — Z20822 Contact with and (suspected) exposure to covid-19: Secondary | ICD-10-CM | POA: Insufficient documentation

## 2019-10-30 DIAGNOSIS — R300 Dysuria: Secondary | ICD-10-CM | POA: Diagnosis not present

## 2019-10-30 DIAGNOSIS — R112 Nausea with vomiting, unspecified: Secondary | ICD-10-CM | POA: Diagnosis not present

## 2019-10-30 DIAGNOSIS — R102 Pelvic and perineal pain: Secondary | ICD-10-CM | POA: Diagnosis not present

## 2019-10-30 DIAGNOSIS — R5383 Other fatigue: Secondary | ICD-10-CM | POA: Diagnosis not present

## 2019-10-30 DIAGNOSIS — Z87891 Personal history of nicotine dependence: Secondary | ICD-10-CM | POA: Insufficient documentation

## 2019-10-30 DIAGNOSIS — J45909 Unspecified asthma, uncomplicated: Secondary | ICD-10-CM | POA: Insufficient documentation

## 2019-10-30 DIAGNOSIS — R630 Anorexia: Secondary | ICD-10-CM | POA: Diagnosis not present

## 2019-10-30 DIAGNOSIS — R079 Chest pain, unspecified: Secondary | ICD-10-CM | POA: Insufficient documentation

## 2019-10-30 DIAGNOSIS — R1084 Generalized abdominal pain: Secondary | ICD-10-CM | POA: Diagnosis not present

## 2019-10-30 DIAGNOSIS — A64 Unspecified sexually transmitted disease: Secondary | ICD-10-CM | POA: Insufficient documentation

## 2019-10-30 LAB — CBC WITH DIFFERENTIAL/PLATELET
Abs Immature Granulocytes: 0.01 10*3/uL (ref 0.00–0.07)
Basophils Absolute: 0 10*3/uL (ref 0.0–0.1)
Basophils Relative: 1 %
Eosinophils Absolute: 0 10*3/uL (ref 0.0–0.5)
Eosinophils Relative: 0 %
HCT: 31.9 % — ABNORMAL LOW (ref 36.0–46.0)
Hemoglobin: 10.8 g/dL — ABNORMAL LOW (ref 12.0–15.0)
Immature Granulocytes: 0 %
Lymphocytes Relative: 17 %
Lymphs Abs: 1 10*3/uL (ref 0.7–4.0)
MCH: 30.1 pg (ref 26.0–34.0)
MCHC: 33.9 g/dL (ref 30.0–36.0)
MCV: 88.9 fL (ref 80.0–100.0)
Monocytes Absolute: 0.4 10*3/uL (ref 0.1–1.0)
Monocytes Relative: 7 %
Neutro Abs: 4.2 10*3/uL (ref 1.7–7.7)
Neutrophils Relative %: 75 %
Platelets: 162 10*3/uL (ref 150–400)
RBC: 3.59 MIL/uL — ABNORMAL LOW (ref 3.87–5.11)
RDW: 12.9 % (ref 11.5–15.5)
WBC: 5.5 10*3/uL (ref 4.0–10.5)
nRBC: 0 % (ref 0.0–0.2)

## 2019-10-30 LAB — COMPREHENSIVE METABOLIC PANEL
ALT: 34 U/L (ref 0–44)
AST: 33 U/L (ref 15–41)
Albumin: 4.2 g/dL (ref 3.5–5.0)
Alkaline Phosphatase: 49 U/L (ref 38–126)
Anion gap: 11 (ref 5–15)
BUN: 16 mg/dL (ref 6–20)
CO2: 26 mmol/L (ref 22–32)
Calcium: 9.1 mg/dL (ref 8.9–10.3)
Chloride: 99 mmol/L (ref 98–111)
Creatinine, Ser: 1.04 mg/dL — ABNORMAL HIGH (ref 0.44–1.00)
GFR calc Af Amer: >60 mL/min (ref >60)
GFR calc non Af Amer: >60 mL/min (ref >60)
Glucose, Bld: 137 mg/dL — ABNORMAL HIGH (ref 70–99)
Potassium: 3.2 mmol/L — ABNORMAL LOW (ref 3.5–5.1)
Sodium: 136 mmol/L (ref 135–145)
Total Bilirubin: 0.8 mg/dL (ref 0.3–1.2)
Total Protein: 7.2 g/dL (ref 6.5–8.1)

## 2019-10-30 LAB — URINALYSIS, MICROSCOPIC (REFLEX)

## 2019-10-30 LAB — URINALYSIS, ROUTINE W REFLEX MICROSCOPIC
Bilirubin Urine: NEGATIVE
Glucose, UA: NEGATIVE mg/dL
Hgb urine dipstick: NEGATIVE
Ketones, ur: NEGATIVE mg/dL
Nitrite: NEGATIVE
Protein, ur: NEGATIVE mg/dL
Specific Gravity, Urine: 1.015 (ref 1.005–1.030)
pH: 6 (ref 5.0–8.0)

## 2019-10-30 LAB — PREGNANCY, URINE: Preg Test, Ur: NEGATIVE

## 2019-10-30 LAB — SARS CORONAVIRUS 2 BY RT PCR (HOSPITAL ORDER, PERFORMED IN ~~LOC~~ HOSPITAL LAB): SARS Coronavirus 2: NEGATIVE

## 2019-10-30 LAB — LIPASE, BLOOD: Lipase: 17 U/L (ref 11–51)

## 2019-10-30 MED ORDER — DEXTROSE 5 % IV SOLN
500.0000 mg | Freq: Once | INTRAVENOUS | Status: DC
  Filled 2019-10-30: qty 500

## 2019-10-30 MED ORDER — METRONIDAZOLE 500 MG PO TABS
2000.0000 mg | ORAL_TABLET | Freq: Once | ORAL | Status: AC
  Administered 2019-10-30: 2000 mg via ORAL
  Filled 2019-10-30: qty 4

## 2019-10-30 MED ORDER — PROMETHAZINE HCL 25 MG/ML IJ SOLN
12.5000 mg | Freq: Once | INTRAMUSCULAR | Status: AC
  Administered 2019-10-30: 12.5 mg via INTRAVENOUS
  Filled 2019-10-30: qty 1

## 2019-10-30 MED ORDER — LIDOCAINE HCL (PF) 1 % IJ SOLN
INTRAMUSCULAR | Status: AC
  Administered 2019-10-30: 5 mL
  Filled 2019-10-30: qty 5

## 2019-10-30 MED ORDER — DOXYCYCLINE HYCLATE 100 MG PO CAPS
100.0000 mg | ORAL_CAPSULE | Freq: Two times a day (BID) | ORAL | 0 refills | Status: AC
Start: 2019-10-30 — End: 2019-11-13

## 2019-10-30 MED ORDER — AZITHROMYCIN 1 G PO PACK
1.0000 g | PACK | Freq: Once | ORAL | Status: AC
  Administered 2019-10-30: 1 g via ORAL
  Filled 2019-10-30: qty 1

## 2019-10-30 MED ORDER — LORAZEPAM 2 MG/ML IJ SOLN
0.5000 mg | Freq: Once | INTRAMUSCULAR | Status: AC
  Administered 2019-10-30: 0.5 mg via INTRAVENOUS
  Filled 2019-10-30: qty 1

## 2019-10-30 MED ORDER — CEFTRIAXONE SODIUM 500 MG IJ SOLR
500.0000 mg | Freq: Once | INTRAMUSCULAR | Status: AC
  Administered 2019-10-30: 500 mg via INTRAMUSCULAR
  Filled 2019-10-30: qty 500

## 2019-10-30 MED ORDER — KETOROLAC TROMETHAMINE 15 MG/ML IJ SOLN
15.0000 mg | Freq: Once | INTRAMUSCULAR | Status: AC
  Administered 2019-10-30: 15 mg via INTRAVENOUS
  Filled 2019-10-30: qty 1

## 2019-10-30 MED ORDER — ONDANSETRON HCL 4 MG PO TABS
4.0000 mg | ORAL_TABLET | Freq: Four times a day (QID) | ORAL | 0 refills | Status: AC
Start: 2019-10-30 — End: ?

## 2019-10-30 MED ORDER — SODIUM CHLORIDE 0.9 % IV BOLUS (SEPSIS)
1000.0000 mL | Freq: Once | INTRAVENOUS | Status: AC
  Administered 2019-10-30: 1000 mL via INTRAVENOUS

## 2019-10-30 MED ORDER — ONDANSETRON HCL 4 MG/2ML IJ SOLN
4.0000 mg | Freq: Once | INTRAMUSCULAR | Status: AC
  Administered 2019-10-30: 4 mg via INTRAVENOUS
  Filled 2019-10-30: qty 2

## 2019-10-30 MED ORDER — HYDROCODONE-ACETAMINOPHEN 5-325 MG PO TABS
1.0000 | ORAL_TABLET | Freq: Once | ORAL | Status: AC
  Administered 2019-10-30: 1 via ORAL
  Filled 2019-10-30: qty 1

## 2019-10-30 NOTE — ED Notes (Signed)
XR at bedside

## 2019-10-30 NOTE — ED Notes (Signed)
Pt discharged to home. Discharge instructions have been discussed with patient and/or family members. Pt verbally acknowledges understanding d/c instructions, and endorses comprehension to checkout at registration before leaving.  °

## 2019-10-30 NOTE — ED Notes (Signed)
Pt vomited approx 45 mins after administration of flagyl and zithromax. Provider aware. IVF, phenergan and ativan given per order. Room mopped and new blanket provided. Crackers and gingerale given for PO challenge

## 2019-10-30 NOTE — ED Triage Notes (Signed)
Pt c/o generalized weakness, low back pain, abd pain, and pelvic pain since this morning.

## 2019-10-30 NOTE — ED Notes (Signed)
Patient transported to CT 

## 2019-10-30 NOTE — ED Notes (Signed)
Pt requesting evaluation again stating her chest is hurting. Pt brought back into triage for EKG and recheck.

## 2019-10-30 NOTE — ED Notes (Signed)
Attempted IV, unable to obtain

## 2019-10-30 NOTE — ED Notes (Signed)
Patient transported to US 

## 2019-10-30 NOTE — Discharge Instructions (Addendum)
We saw you today because you were feeling unwell. Your covid test was negative and your lab work was reassuring. Your scans were also reassuring. You tested positive for Trichomoniasis which is a sexually transmitted infection. Please let your sexual partner know so that they can be treated as well. Your gonorrhea and chlamydia tests will take a few days to return but we have treated you with antibiotics for these as well. Continue to take the antibiotics at home.

## 2019-10-30 NOTE — ED Notes (Signed)
Patient transported to Ultrasound 

## 2019-10-30 NOTE — ED Provider Notes (Signed)
[facility] EMERGENCY DEPARTMENT Provider Note   CSN: [id number] Arrival date & time: 10/30/19  [id number]     History Chief Complaint  Patient presents with  . Weakness  . Abdominal Pain    Maureen Russell is a 29 y.o. female.  Patient is a 29 year old female with past medical history of asthma who presents emergency department for feeling unwell.  Patient reports that this morning she woke up feeling unwell.  Reports that she has been dealing with mid back pain which is being worked up by her primary medical doctor and she is scheduled for an MRI because of a history of a "bulging disc".  She also reports that she has been having some burning with urination and generalized abdominal pain with nausea and vomiting.  Reports 4 episodes of vomiting today.  Denies any constipation or diarrhea, vaginal discharge.  She reports that she is not sexually active.  She denies any fever, chills, coughing or other URI symptoms.  She does report that while waiting in the waiting room she began to have a constant central chest pain which is worse with palpation.  She reports she would like to be tested for COVID-19.        Past Medical History:  Diagnosis Date  . Asthma   . Bronchitis   . Herpes   . Thyroid disease     Patient Active Problem List   Diagnosis Date Noted  . Low back pain 11/02/2018    Past Surgical History:  Procedure Laterality Date  . CESAREAN SECTION    . TONSILLECTOMY    . TUBAL LIGATION       OB History   No obstetric history on file.     No family history on file.  Social History   Tobacco Use  . Smoking status: Former Games developermoker  . Smokeless tobacco: Never Used  Substance Use Topics  . Alcohol use: No  . Drug use: No    Home Medications Prior to Admission medications   Medication Sig Start Date End Date Taking? Authorizing Provider  doxycycline (VIBRAMYCIN) 100 MG capsule Take 1 capsule (100 mg total) by mouth 2 (two) times daily for 14 days.  10/30/19 11/13/19  Arlyn DunningMcLean, Cherisse Carrell A, PA-C  levothyroxine (SYNTHROID) 100 MCG tablet TK 7 TS PO Q WEEK OR AS DIRECTED 07/03/18   [provider]  ondansetron (ZOFRAN) 4 MG tablet Take 1 tablet (4 mg total) by mouth every 6 (six) hours. 10/30/19   Arlyn DunningMcLean, Farron Lafond A, PA-C  predniSONE (STERAPRED UNI-PAK 21 TAB) 10 MG (21) TBPK tablet Take by mouth as directed. 10/26/19   [provider]    Allergies    Patient has no known allergies.  Review of Systems   Review of Systems  Constitutional: Positive for appetite change and fatigue. Negative for chills and fever.  HENT: Negative for congestion, sneezing and sore throat.   Eyes: Negative for visual disturbance.  Respiratory: Negative for cough and shortness of breath.   Cardiovascular: Positive for chest pain.  Gastrointestinal: Positive for abdominal pain, nausea and vomiting. Negative for blood in stool, constipation and diarrhea.  Endocrine: Negative for polyuria.  Genitourinary: Positive for dysuria and hematuria. Negative for decreased urine volume, difficulty urinating, flank pain, menstrual problem, pelvic pain, vaginal bleeding, vaginal discharge and vaginal pain.  Musculoskeletal: Positive for back pain.  Skin: Negative for rash.  Allergic/Immunologic: Negative for immunocompromised state.  Neurological: Negative for dizziness, light-headedness and headaches.  All other systems reviewed and are negative.  Physical Exam Updated Vital Signs BP (!) 136/81   Pulse 52   Temp 97.9 F (36.6 C) (Oral)   Resp 14   Ht 5\' 4"  (1.626 m)   Wt 61.2 kg   LMP 10/21/2019   SpO2 100%   BMI 23.17 kg/m   Physical Exam Vitals and nursing note reviewed.  Constitutional:      Appearance: Normal appearance. She is well-developed.  HENT:     Head: Normocephalic.     Mouth/Throat:     Mouth: Mucous membranes are moist.  Eyes:     Extraocular Movements: Extraocular movements intact.     Conjunctiva/sclera: Conjunctivae normal.    Cardiovascular:     Rate and Rhythm: Normal rate and regular rhythm.     Heart sounds: Normal heart sounds.  Pulmonary:     Effort: Pulmonary effort is normal.  Chest:     Comments: Reproducible chest TTP Abdominal:     General: Abdomen is flat. Bowel sounds are normal.     Palpations: Abdomen is soft.     Tenderness: There is generalized abdominal tenderness. There is no right CVA tenderness, left CVA tenderness, guarding or rebound.  Musculoskeletal:     Comments: Mid thoracic back pain TTP  Skin:    General: Skin is warm and dry.     Coloration: Skin is not mottled or pale.     Findings: No erythema or rash.  Neurological:     General: No focal deficit present.     Mental Status: She is alert.  Psychiatric:        Mood and Affect: Mood normal.     ED Results / Procedures / Treatments   Labs (all labs ordered are listed, but only abnormal results are displayed) Labs Reviewed  CBC WITH DIFFERENTIAL/PLATELET - Abnormal; Notable for the following components:      Result Value   RBC 3.59 (*)    Hemoglobin 10.8 (*)    HCT 31.9 (*)    All other components within normal limits  COMPREHENSIVE METABOLIC PANEL - Abnormal; Notable for the following components:   Potassium 3.2 (*)    Glucose, Bld 137 (*)    Creatinine, Ser 1.04 (*)    All other components within normal limits  URINALYSIS, ROUTINE W REFLEX MICROSCOPIC - Abnormal; Notable for the following components:   Leukocytes,Ua LARGE (*)    All other components within normal limits  URINALYSIS, MICROSCOPIC (REFLEX) - Abnormal; Notable for the following components:   Bacteria, UA FEW (*)    Trichomonas, UA PRESENT (*)    All other components within normal limits  SARS CORONAVIRUS 2 BY RT PCR (HOSPITAL ORDER, PERFORMED IN [facility])  LIPASE, BLOOD  PREGNANCY, URINE  GC/CHLAMYDIA PROBE AMP ([place]) NOT AT [facility]    EKG EKG Interpretation  Date/Time:  Saturday October 30 2019 10:27:33 EDT Ventricular  Rate:  65 PR Interval:  124 QRS Duration: 74 QT Interval:  396 QTC Calculation: 411 R Axis:   75 Text Interpretation: Normal sinus rhythm Nonspecific ST abnormality Abnormal ECG Confirmed by 09-25-1989 [phone]) on 10/30/2019 10:34:46 AM   Radiology 11/01/2019 PELVIS TRANSVAGINAL NON-OB (TV ONLY)  Result Date: 10/30/2019 CLINICAL DATA:  Generalized abdominal, pelvic and chest pain today. Possible UTI. Possible fluid around right ovary on CT today. EXAM: ULTRASOUND PELVIS TRANSVAGINAL TECHNIQUE: Transvaginal ultrasound examination of the pelvis was performed including evaluation of the uterus, ovaries, adnexal regions, and pelvic cul-de-sac. COMPARISON:  CT abdomen pelvis earlier today FINDINGS: Uterus  Measurements: 9.9 x 4.7 x 6.3 cm = volume: 154 mL. No fibroids or other mass visualized. Endometrium Thickness: 11 mm.  Tiny amount of fluid over the endocervical canal. Right ovary Measurements: 3.7 x 5.3 x 2.8 cm = volume: 12.3 mL. Normal appearance/no adnexal mass. Normal color Doppler. Very minimal free fluid adjacent the right ovary. Left ovary Not visualized. Other findings:  Trace free fluid. IMPRESSION: 1.  Normal uterus and right ovary.  Left ovary not visualized. 2. Very minimal amount of nonspecific free fluid in the pelvis and adjacent the right ovary. Electronically Signed   By: Elberta Fortis M.D.   On: 10/30/2019 15:16   DG Chest Portable 1 View  Result Date: 10/30/2019 CLINICAL DATA:  Generalized weakness with low back pain and abdominal pain since this morning. Sudden onset chest pain in waiting room. EXAM: PORTABLE CHEST 1 VIEW COMPARISON:  06/30/2019 FINDINGS: Lungs are clear. Cardiomediastinal silhouette is normal. Air collection under the left diaphragm likely air and distended stomach although cannot completely exclude free peritoneal air. Remainder the exam is unremarkable. IMPRESSION: 1.  No acute cardiopulmonary disease. 2. Collection of air under the left hemidiaphragm likely within  the stomach, although free peritoneal air is possible. Recommend upright PA and lateral chest radiograph with right lateral decubitus film for further evaluation. These results were called by telephone at the time of interpretation on 10/30/2019 at 11:33 am to provider Dr. Criss Alvine, who verbally acknowledged these results. Electronically Signed   By: Elberta Fortis M.D.   On: 10/30/2019 11:38   CT Renal Stone Study  Result Date: 10/30/2019 CLINICAL DATA:  Low back pain and flank pain as well as hematuria. Possible kidney stone. EXAM: CT ABDOMEN AND PELVIS WITHOUT CONTRAST TECHNIQUE: Multidetector CT imaging of the abdomen and pelvis was performed following the standard protocol without IV contrast. COMPARISON:  01/09/2013 FINDINGS: Lower chest: Lung bases are clear. Hepatobiliary: Liver, gallbladder and biliary tree are normal. Pancreas: Normal. Spleen: Normal. Adrenals/Urinary Tract: Adrenal glands are normal. Kidneys are normal in size without hydronephrosis or nephrolithiasis. No adjacent perinephric inflammation or fluid. Ureters and bladder are within normal. Stomach/Bowel: Stomach and small bowel are normal. Appendix is normal. Colon is unremarkable. Vascular/Lymphatic: Abdominal aorta is normal in caliber. Remaining vascular structures are unremarkable. No evidence of adenopathy. Reproductive: Uterus mildly prominent in just left of midline. Ovaries unremarkable. Possible small amount of free fluid adjacent right ovary. Superolateral location of the left ovary. Other: No significant free fluid or focal inflammatory change. Several pelvic phleboliths. Musculoskeletal: No focal abnormality. IMPRESSION: 1. No acute findings in the abdomen/pelvis. No evidence of renal/ureteral stones or obstruction. 2. Possible small amount of fluid adjacent the right ovary which is not specific. Consider pelvic ultrasound for further evaluation if clinically indicated. Electronically Signed   By: Elberta Fortis M.D.   On:  10/30/2019 13:56    Procedures Procedures (including critical care time)  Medications Ordered in ED Medications  ondansetron (ZOFRAN) injection 4 mg (4 mg Intravenous Given 10/30/19 1211)  sodium chloride 0.9 % bolus 1,000 mL (0 mLs Intravenous Stopped 10/30/19 1341)  ketorolac (TORADOL) 15 MG/ML injection 15 mg (15 mg Intravenous Given 10/30/19 1211)  HYDROcodone-acetaminophen (NORCO/VICODIN) 5-325 MG per tablet 1 tablet (1 tablet Oral Given 10/30/19 1418)  metroNIDAZOLE (FLAGYL) tablet 2,000 mg (2,000 mg Oral Given 10/30/19 1501)  azithromycin (ZITHROMAX) powder 1 g (1 g Oral Given 10/30/19 1502)  cefTRIAXone (ROCEPHIN) injection 500 mg (500 mg Intramuscular Given 10/30/19 1504)  lidocaine (PF) (XYLOCAINE) 1 % injection (  5 mLs  Given 10/30/19 1505)  sodium chloride 0.9 % bolus 1,000 mL (1,000 mLs Intravenous New Bag/Given 10/30/19 1552)  promethazine (PHENERGAN) injection 12.5 mg (12.5 mg Intravenous Given 10/30/19 1557)  LORazepam (ATIVAN) injection 0.5 mg (0.5 mg Intravenous Given 10/30/19 1553)    ED Course  I have reviewed the triage vital signs and the nursing notes.  Pertinent labs & imaging results that were available during my care of the patient were reviewed by me and considered in my medical decision making (see chart for details).  Clinical Course as of Oct 30 1802  Sat Oct 30, 2019  [id number] 29 year old female presenting for nausea, vomiting, dysuria, body aches and chest pain since this morning.  Overall well-appearing on physical exam with mild and diffuse tenderness.  She has mid back pain as well but this is chronic.  Her work-up revealed a normal white count.  Hemoglobin of 10.8 which is consistent with her hemoglobins in the past.  Her creatinine is 1.04 and potassium 3.2.  Otherwise unremarkable metabolic panel.  Her urinalysis is present for trichomonas.  Initially she told me that she had not been sexually active.  She now admits that she was sexually active 1 month ago.  She  still declines a pelvic exam but agrees to have gonorrhea and chlamydia run on her urine.  We will treat her for all 3 of those today.  This is likely the cause of her symptoms.  Awaiting CT scan results.  She is being treated with Flagyl, Rocephin, azithromycin, fluids, Zofran and pain medication.   [KM]  1527 Unremarkable CT and pelvic ultrasound. However, she vomited the antibiotics given PO here and reports still feeling unnwell. We will repeat her vitals and give her additional antiemetics, fluids. She is asking for food.    [KM]  1738 Patient improved, tolerating PO. D/c ome with return precautions   [KM]    Clinical Course User Index [KM] Jeral Pinch   MDM Rules/Calculators/A&P                          Based on review of vitals, medical screening exam, lab work and/or imaging, there does not appear to be an acute, emergent etiology for the patient's symptoms. Counseled pt on good return precautions and encouraged both PCP and ED follow-up as needed.  Prior to discharge, I also discussed incidental imaging findings with patient in detail and advised appropriate, recommended follow-up in detail.  Clinical Impression: 1. Generalized abdominal pain   2. Pelvic pain in female   3. Sexually transmitted infection     Disposition: Discharge  Prior to providing a prescription for a controlled substance, I independently reviewed the patient's recent prescription history on the [place] Controlled Substance Reporting System. The patient had no recent or regular prescriptions and was deemed appropriate for a brief, less than 3 day prescription of narcotic for acute analgesia.  This note was prepared with assistance of Conservation officer, historic buildings. Occasional wrong-word or sound-a-like substitutions may have occurred due to the inherent limitations of voice recognition software.  Final Clinical Impression(s) / ED Diagnoses Final diagnoses:  Generalized abdominal pain    Sexually transmitted infection    Rx / DC Orders ED Discharge Orders         Ordered    doxycycline (VIBRAMYCIN) 100 MG capsule  2 times daily     Discontinue  Reprint     10/30/19 1713  ondansetron (ZOFRAN) 4 MG tablet  Every 6 hours     Discontinue  Reprint     10/30/19 1713           Jeral Pinch 10/30/19 1805    Pricilla Loveless, MD 11/03/19 [phone]

## 2019-10-30 NOTE — ED Notes (Signed)
Pt ambulatory with steady gait to restroom 

## 2019-11-01 LAB — GC/CHLAMYDIA PROBE AMP (~~LOC~~) NOT AT ARMC
Chlamydia: NEGATIVE
Comment: NEGATIVE
Comment: NORMAL
Neisseria Gonorrhea: NEGATIVE

## 2020-04-09 ENCOUNTER — Emergency Department (HOSPITAL_BASED_OUTPATIENT_CLINIC_OR_DEPARTMENT_OTHER)
Admission: EM | Admit: 2020-04-09 | Discharge: 2020-04-09 | Disposition: A | Discharge status: Home / Self Care | Payer: Medicaid Other | Attending: Emergency Medicine | Admitting: Emergency Medicine

## 2020-04-09 ENCOUNTER — Encounter (HOSPITAL_BASED_OUTPATIENT_CLINIC_OR_DEPARTMENT_OTHER): Payer: Self-pay | Admitting: Emergency Medicine

## 2020-04-09 ENCOUNTER — Other Ambulatory Visit: Payer: Self-pay

## 2020-04-09 DIAGNOSIS — R109 Unspecified abdominal pain: Secondary | ICD-10-CM | POA: Diagnosis not present

## 2020-04-09 DIAGNOSIS — R6883 Chills (without fever): Secondary | ICD-10-CM | POA: Diagnosis not present

## 2020-04-09 DIAGNOSIS — R519 Headache, unspecified: Secondary | ICD-10-CM | POA: Insufficient documentation

## 2020-04-09 DIAGNOSIS — J45909 Unspecified asthma, uncomplicated: Secondary | ICD-10-CM | POA: Insufficient documentation

## 2020-04-09 DIAGNOSIS — Z87891 Personal history of nicotine dependence: Secondary | ICD-10-CM | POA: Diagnosis not present

## 2020-04-09 DIAGNOSIS — Z20822 Contact with and (suspected) exposure to covid-19: Secondary | ICD-10-CM | POA: Diagnosis not present

## 2020-04-09 DIAGNOSIS — M791 Myalgia, unspecified site: Secondary | ICD-10-CM | POA: Diagnosis not present

## 2020-04-09 MED ORDER — ACETAMINOPHEN 500 MG PO TABS
1000.0000 mg | ORAL_TABLET | Freq: Once | ORAL | Status: AC
  Administered 2020-04-09: 1000 mg via ORAL
  Filled 2020-04-09: qty 2

## 2020-04-09 NOTE — ED Triage Notes (Signed)
Pt c/o headache and body aches onset yesterday. Pt is vaccinated.

## 2020-04-09 NOTE — Discharge Instructions (Signed)
You are tested for COVID. Stay home until your results come back. If you are positive, please stay home for 10 days   Take tylenol, motrin for fever   See your doctor   Return to ER if you have worse headaches, vomiting, trouble breathing      Person Under Monitoring Name: Maureen Russell  Location: [address] [place] [address]   Infection Prevention Recommendations for Individuals Confirmed to have, or Being Evaluated for, 2019 Novel Coronavirus (COVID-19) Infection Who Receive Care at Home  Individuals who are confirmed to have, or are being evaluated for, COVID-19 should follow the prevention steps below until a healthcare provider or local or state health department says they can return to normal activities.  Stay home except to get medical care You should restrict activities outside your home, except for getting medical care. Do not go to work, school, or public areas, and do not use public transportation or taxis.  Call ahead before visiting your doctor Before your medical appointment, call the healthcare provider and tell them that you have, or are being evaluated for, COVID-19 infection. This will help the healthcare providers office take steps to keep other people from getting infected. Ask your healthcare provider to call the local or state health department.  Monitor your symptoms Seek prompt medical attention if your illness is worsening (e.g., difficulty breathing). Before going to your medical appointment, call the healthcare provider and tell them that you have, or are being evaluated for, COVID-19 infection. Ask your healthcare provider to call the local or state health department.  Wear a facemask You should wear a facemask that covers your nose and mouth when you are in the same room with other people and when you visit a healthcare provider. People who live with or visit you should also wear a facemask while they are in the same room with  you.  Separate yourself from other people in your home As much as possible, you should stay in a different room from other people in your home. Also, you should use a separate bathroom, if available.  Avoid sharing household items You should not share dishes, drinking glasses, cups, eating utensils, towels, bedding, or other items with other people in your home. After using these items, you should wash them thoroughly with soap and water.  Cover your coughs and sneezes Cover your mouth and nose with a tissue when you cough or sneeze, or you can cough or sneeze into your sleeve. Throw used tissues in a lined trash can, and immediately wash your hands with soap and water for at least 20 seconds or use an alcohol-based hand rub.  Wash your [employer] your hands often and thoroughly with soap and water for at least 20 seconds. You can use an alcohol-based hand sanitizer if soap and water are not available and if your hands are not visibly dirty. Avoid touching your eyes, nose, and mouth with unwashed hands.   Prevention Steps for Caregivers and Household Members of Individuals Confirmed to have, or Being Evaluated for, COVID-19 Infection Being Cared for in the Home  If you live with, or provide care at home for, a person confirmed to have, or being evaluated for, COVID-19 infection please follow these guidelines to prevent infection:  Follow healthcare providers instructions Make sure that you understand and can help the patient follow any healthcare provider instructions for all care.  Provide for the patients basic needs You should help the patient with  basic needs in the home and provide support for getting groceries, prescriptions, and other personal needs.  Monitor the patients symptoms If they are getting sicker, call his or her medical provider and tell them that the patient has, or is being evaluated for, COVID-19 infection. This will help the healthcare providers office  take steps to keep other people from getting infected. Ask the healthcare provider to call the local or state health department.  Limit the number of people who have contact with the patient If possible, have only one caregiver for the patient. Other household members should stay in another home or place of residence. If this is not possible, they should stay in another room, or be separated from the patient as much as possible. Use a separate bathroom, if available. Restrict visitors who do not have an essential need to be in the home.  Keep older adults, very young children, and other sick people away from the patient Keep older adults, very young children, and those who have compromised immune systems or chronic health conditions away from the patient. This includes people with chronic heart, lung, or kidney conditions, diabetes, and cancer.  Ensure good ventilation Make sure that shared spaces in the home have good air flow, such as from an air conditioner or an opened window, weather permitting.  Wash your hands often Wash your hands often and thoroughly with soap and water for at least 20 seconds. You can use an alcohol based hand sanitizer if soap and water are not available and if your hands are not visibly dirty. Avoid touching your eyes, nose, and mouth with unwashed hands. Use disposable paper towels to dry your hands. If not available, use dedicated cloth towels and replace them when they become wet.  Wear a facemask and gloves Wear a disposable facemask at all times in the room and gloves when you touch or have contact with the patients blood, body fluids, and/or secretions or excretions, such as sweat, saliva, sputum, nasal mucus, vomit, urine, or feces.  Ensure the mask fits over your nose and mouth tightly, and do not touch it during use. Throw out disposable facemasks and gloves after using them. Do not reuse. Wash your hands immediately after removing your facemask and  gloves. If your personal clothing becomes contaminated, carefully remove clothing and launder. Wash your hands after handling contaminated clothing. Place all used disposable facemasks, gloves, and other waste in a lined container before disposing them with other household waste. Remove gloves and wash your hands immediately after handling these items.  Do not share dishes, glasses, or other household items with the patient Avoid sharing household items. You should not share dishes, drinking glasses, cups, eating utensils, towels, bedding, or other items with a patient who is confirmed to have, or being evaluated for, COVID-19 infection. After the person uses these items, you should wash them thoroughly with soap and water.  Wash laundry thoroughly Immediately remove and wash clothes or bedding that have blood, body fluids, and/or secretions or excretions, such as sweat, saliva, sputum, nasal mucus, vomit, urine, or feces, on them. Wear gloves when handling laundry from the patient. Read and follow directions on labels of laundry or clothing items and detergent. In general, wash and dry with the warmest temperatures recommended on the label.  Clean all areas the individual has used often Clean all touchable surfaces, such as counters, tabletops, doorknobs, bathroom fixtures, toilets, phones, keyboards, tablets, and bedside tables, every day. Also, clean any surfaces that may have  blood, body fluids, and/or secretions or excretions on them. Wear gloves when cleaning surfaces the patient has come in contact with. Use a diluted bleach solution (e.g., dilute bleach with 1 part bleach and 10 parts water) or a household disinfectant with a label that says EPA-registered for coronaviruses. To make a bleach solution at home, add 1 tablespoon of bleach to 1 quart (4 cups) of water. For a larger supply, add  cup of bleach to 1 gallon (16 cups) of water. Read labels of cleaning products and follow  recommendations provided on product labels. Labels contain instructions for safe and effective use of the cleaning product including precautions you should take when applying the product, such as wearing gloves or eye protection and making sure you have good ventilation during use of the product. Remove gloves and wash hands immediately after cleaning.  Monitor yourself for signs and symptoms of illness Caregivers and household members are considered close contacts, should monitor their health, and will be asked to limit movement outside of the home to the extent possible. Follow the monitoring steps for close contacts listed on the symptom monitoring form.   ? If you have additional questions, contact your local health department or call the epidemiologist on call at [phone] (available 24/7). ? This guidance is subject to change. For the most up-to-date guidance from [facility], please refer to their website: [email or web address]

## 2020-04-09 NOTE — ED Provider Notes (Signed)
[facility] EMERGENCY DEPARTMENT Provider Note   CSN: [id number] Arrival date & time: 04/09/20  1335     History Chief Complaint  Patient presents with  . Headache    Maureen Russell is a 30 y.o. female here with headache, myalgias, ab pain since yesterday. Patient is fully vaccinated against covid. Patient states that her kids also have some diarrhea. She denies vomiting or trouble urinating. Denies any known close contacts with COVID.   The history is provided by the patient.       Past Medical History:  Diagnosis Date  . Asthma   . Bronchitis   . Herpes   . Thyroid disease     Patient Active Problem List   Diagnosis Date Noted  . Low back pain 11/02/2018    Past Surgical History:  Procedure Laterality Date  . CESAREAN SECTION    . TONSILLECTOMY    . TUBAL LIGATION       OB History   No obstetric history on file.     No family history on file.  Social History   Tobacco Use  . Smoking status: Former Games developer  . Smokeless tobacco: Never Used  Vaping Use  . Vaping Use: Never used  Substance Use Topics  . Alcohol use: No  . Drug use: No    Home Medications Prior to Admission medications   Medication Sig Start Date End Date Taking? Authorizing Provider  levothyroxine (SYNTHROID) 100 MCG tablet TK 7 TS PO Q WEEK OR AS DIRECTED 07/03/18   [provider]  ondansetron (ZOFRAN) 4 MG tablet Take 1 tablet (4 mg total) by mouth every 6 (six) hours. 10/30/19   Arlyn Dunning, PA-C  predniSONE (STERAPRED UNI-PAK 21 TAB) 10 MG (21) TBPK tablet Take by mouth as directed. 10/26/19   [provider]    Allergies    Patient has no known allergies.  Review of Systems   Review of Systems  Constitutional: Positive for chills.  Neurological: Positive for headaches.  All other systems reviewed and are negative.   Physical Exam Updated Vital Signs BP (!) 121/57 (BP Location: Right Arm)   Pulse 77   Temp 98.4 F (36.9 C) (Oral)   Resp  16   Ht 5\' 4"  (1.626 m)   Wt 61.2 kg   LMP 03/16/2020   SpO2 98%   BMI 23.17 kg/m   Physical Exam Vitals and nursing note reviewed.  Constitutional:      Comments: Well appearing   HENT:     Head: Normocephalic.     Mouth/Throat:     Pharynx: Oropharynx is clear.  Eyes:     Extraocular Movements: Extraocular movements intact.     Pupils: Pupils are equal, round, and reactive to light.  Cardiovascular:     Rate and Rhythm: Normal rate and regular rhythm.     Heart sounds: Normal heart sounds.  Pulmonary:     Comments: Slightly tachypneic, no crackles  Abdominal:     General: Bowel sounds are normal.     Palpations: Abdomen is soft.  Musculoskeletal:        General: Normal range of motion.     Cervical back: Normal range of motion and neck supple.  Skin:    General: Skin is warm.  Neurological:     Mental Status: She is alert and oriented to person, place, and time.  Psychiatric:        Mood and Affect: Mood normal.  Behavior: Behavior normal.     ED Results / Procedures / Treatments   Labs (all labs ordered are listed, but only abnormal results are displayed) Labs Reviewed  SARS CORONAVIRUS 2 (TAT 6-24 HRS)    EKG None  Radiology No results found.  Procedures Procedures (including critical care time)  Medications Ordered in ED Medications  acetaminophen (TYLENOL) tablet 1,000 mg (has no administration in time range)    ED Course  I have reviewed the triage vital signs and the nursing notes.  Pertinent labs & imaging results that were available during my care of the patient were reviewed by me and considered in my medical decision making (see chart for details).    MDM Rules/Calculators/A&P                         Maureen Russell is a 30 y.o. female here with headache, body aches, chills. Well appearing, vitals stable. Consider viral infection, especially COVID. Not hypoxic so will not need admission. COVID sent. Will dc home and told her to  stay home for 10 days if she test positive    Final Clinical Impression(s) / ED Diagnoses Final diagnoses:  None    Rx / DC Orders ED Discharge Orders    None       Maureen Pander, MD 04/09/20 2059

## 2020-04-10 ENCOUNTER — Telehealth: Payer: Self-pay | Admitting: Physician Assistant

## 2020-04-10 LAB — SARS CORONAVIRUS 2 (TAT 6-24 HRS): SARS Coronavirus 2: NEGATIVE

## 2020-04-10 NOTE — Telephone Encounter (Signed)
Pt is aware covid 19 test is neg on 04-10-2020

## 2020-08-17 IMAGING — CR DG CHEST 2V
2 series · 2 of 2 positions shown · non-contrast
Comparison: 07/10/2017

CLINICAL DATA: Chest pain.

EXAM:
CHEST - 2 VIEW

[w chest pa]
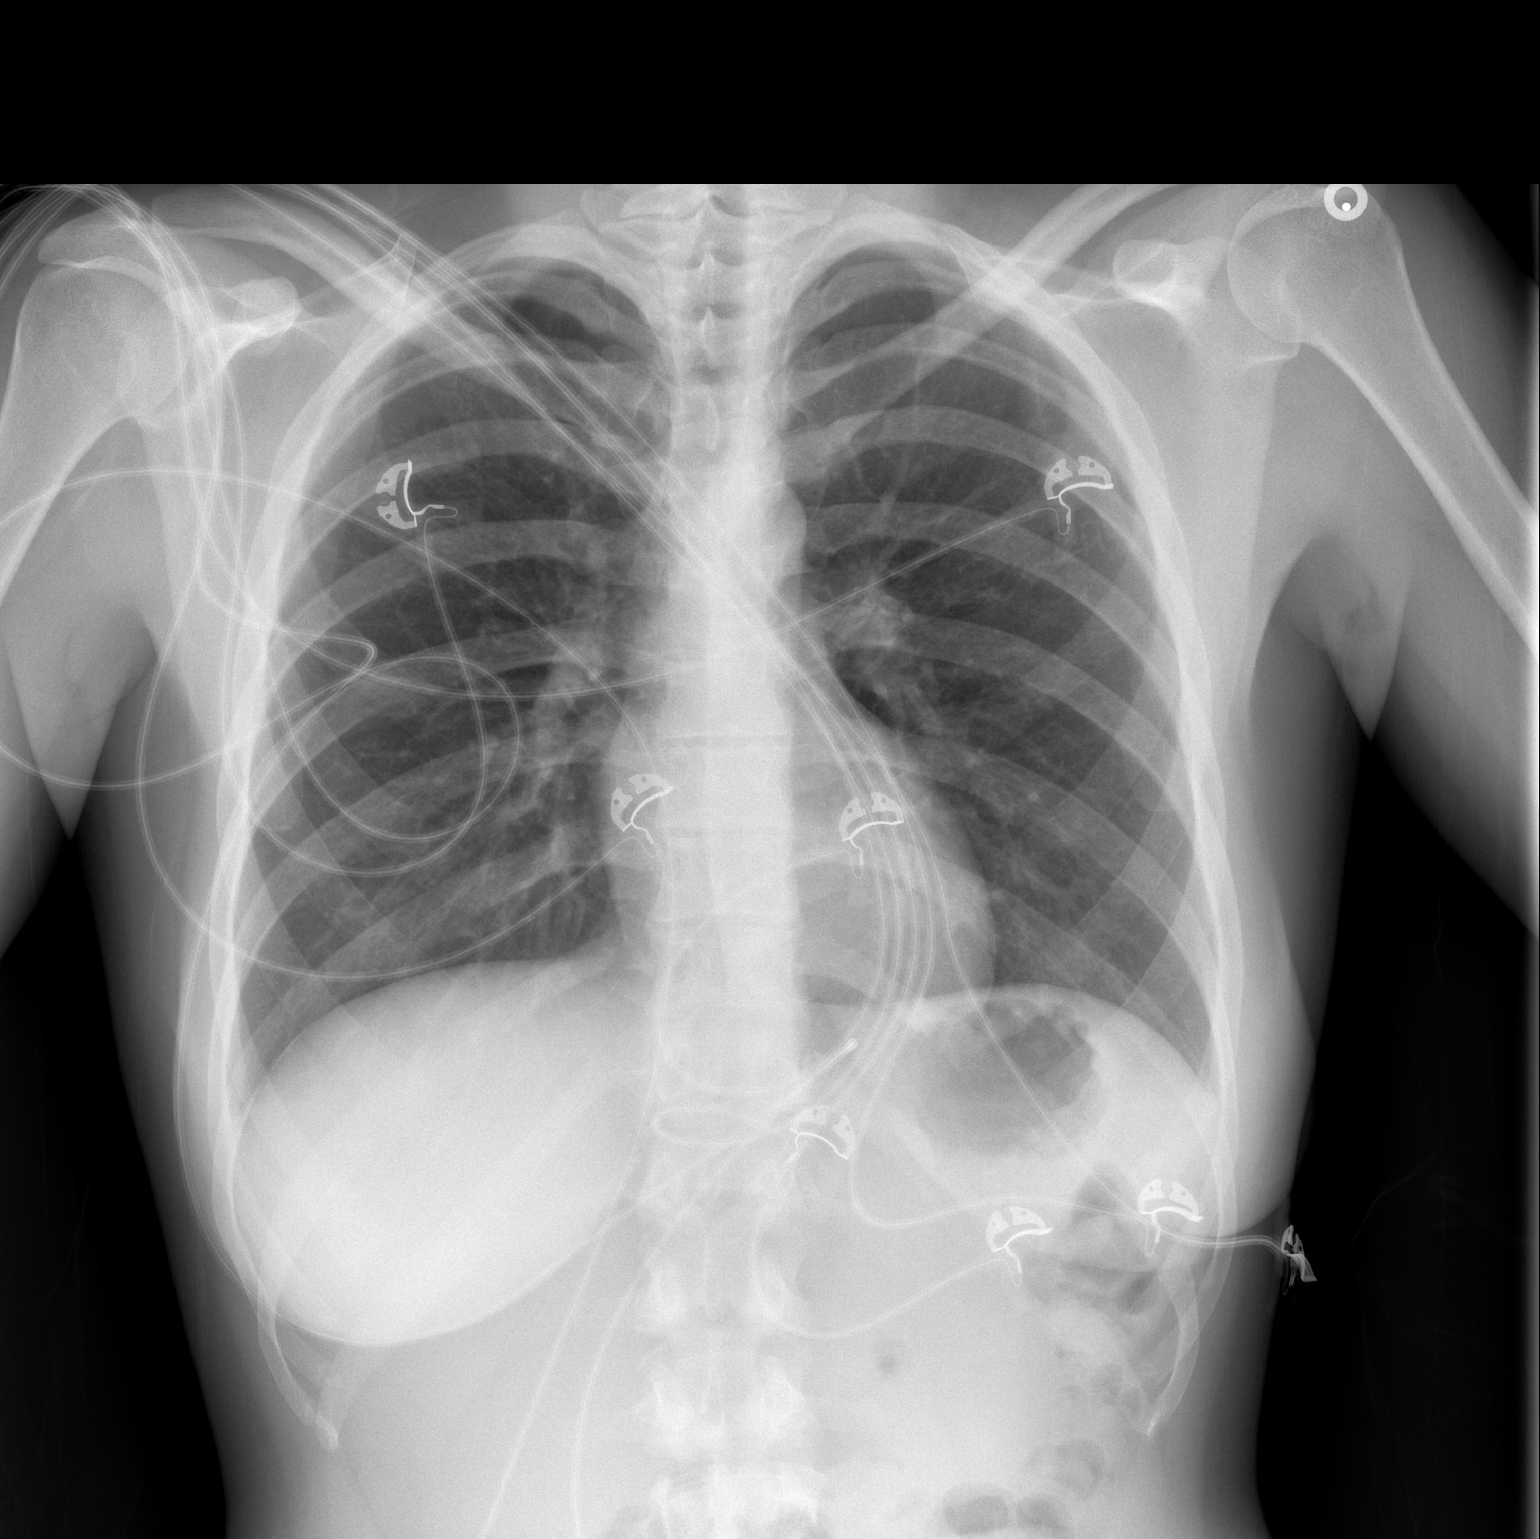

[w chest lat]
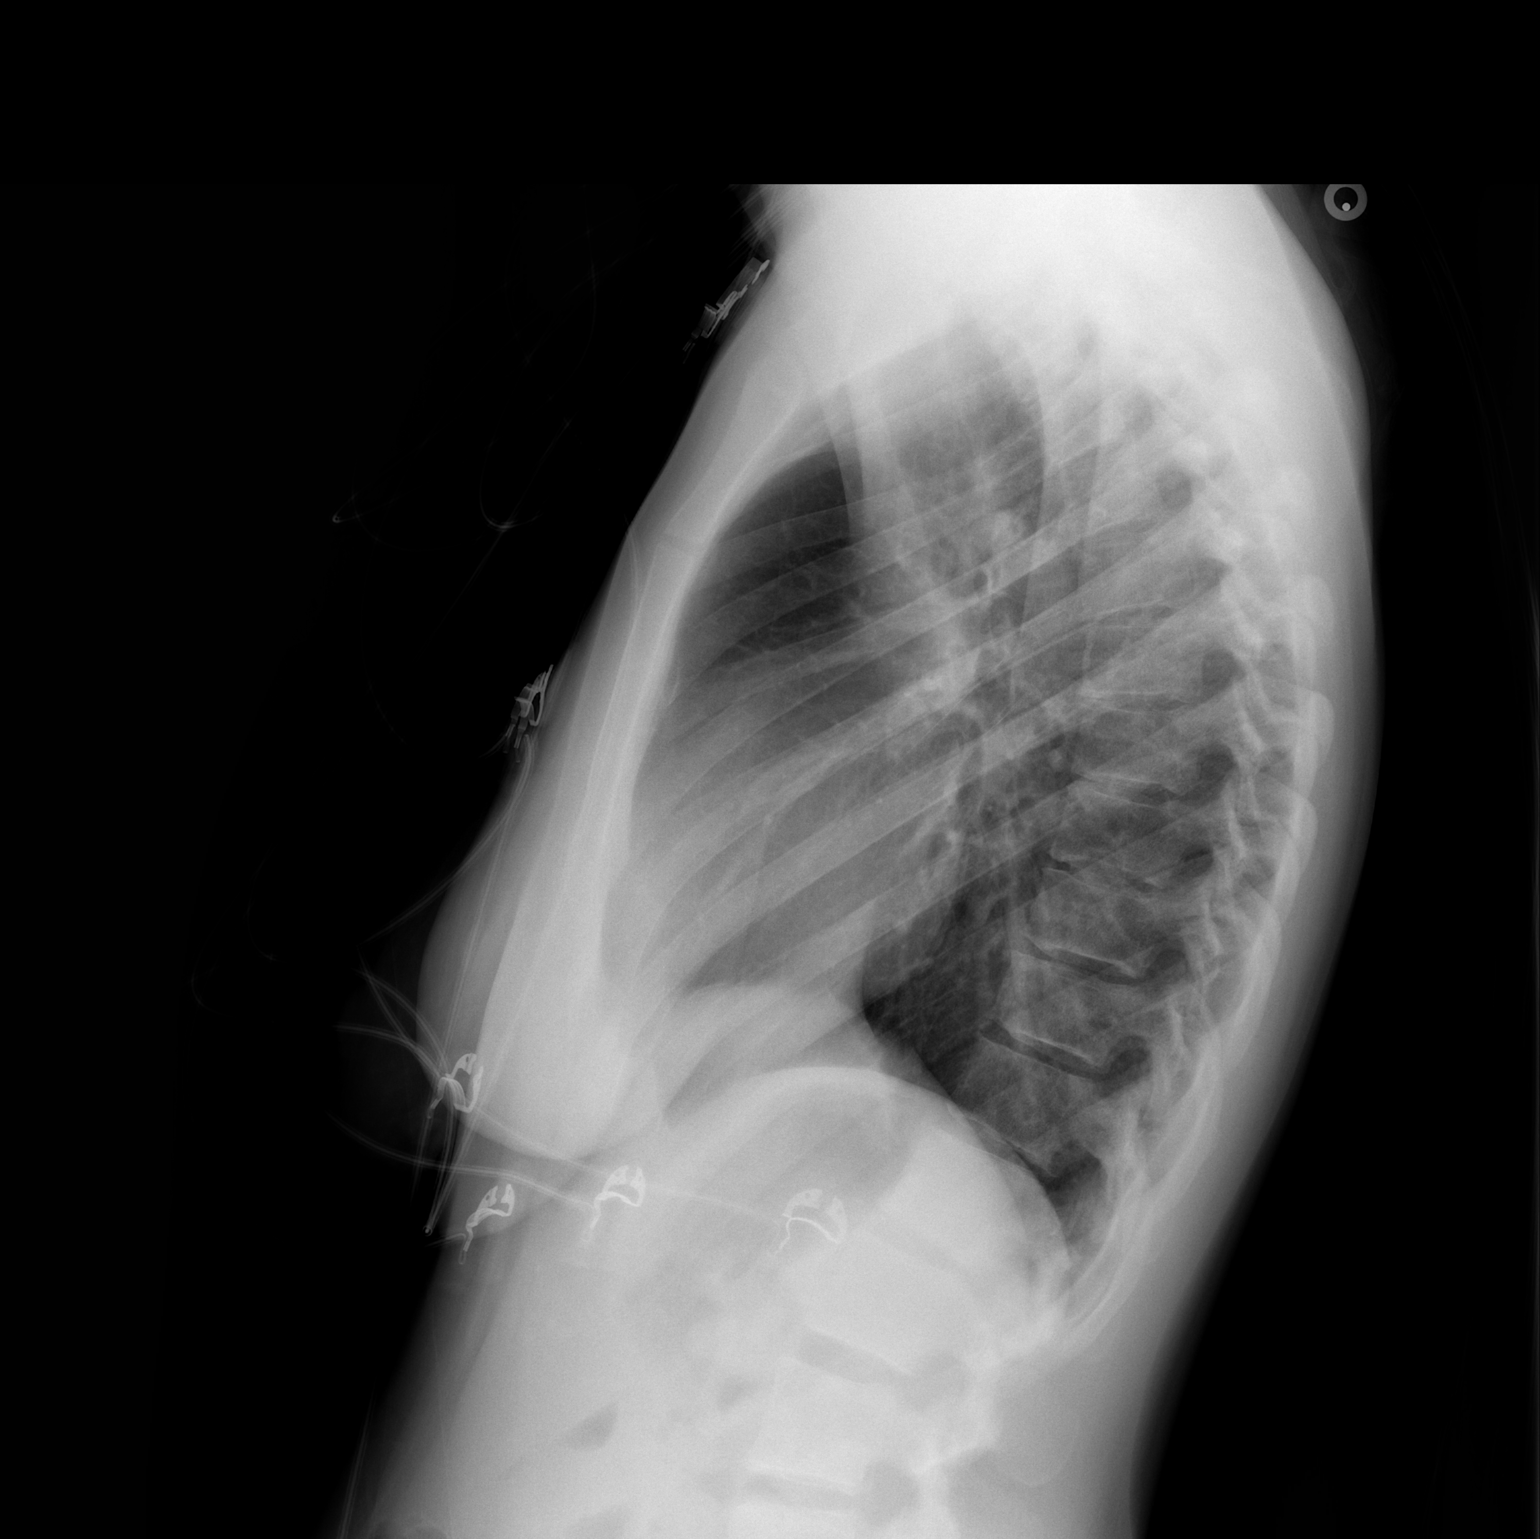

[2 of 2 positions shown; findings below may reference images not displayed]

FINDINGS: Cardiomediastinal contours and hilar structures are normal. Leads
overlie the chest mildly limiting assessment.

Lungs are clear. No pleural effusion.

Visualized skeletal structures are unremarkable.
IMPRESSION: Normal chest

## 2022-07-22 ENCOUNTER — Encounter: Payer: Self-pay | Admitting: *Deleted
